# Patient Record
Sex: Female | Born: 1985 | Race: White | Hispanic: No | Smoking: Current every day smoker
Health system: Southern US, Community
[De-identification: ages and names within clinical notes are randomized; demographics above are authoritative.]

## PROBLEM LIST (undated history)

## (undated) DIAGNOSIS — F419 Anxiety disorder, unspecified: Secondary | ICD-10-CM

## (undated) DIAGNOSIS — R569 Unspecified convulsions: Secondary | ICD-10-CM

## (undated) DIAGNOSIS — J45909 Unspecified asthma, uncomplicated: Secondary | ICD-10-CM

## (undated) DIAGNOSIS — F319 Bipolar disorder, unspecified: Secondary | ICD-10-CM

---
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
## 2007-02-25 ENCOUNTER — Emergency Department: Payer: Self-pay | Admitting: Emergency Medicine

## 2007-04-06 ENCOUNTER — Emergency Department: Payer: Self-pay | Admitting: Emergency Medicine

## 2007-04-07 ENCOUNTER — Emergency Department (HOSPITAL_COMMUNITY): Admission: EM | Admit: 2007-04-07 | Discharge: 2007-04-08 | Payer: Self-pay | Admitting: Emergency Medicine

## 2007-08-26 ENCOUNTER — Observation Stay: Payer: Self-pay | Admitting: Obstetrics and Gynecology

## 2007-10-23 ENCOUNTER — Observation Stay: Payer: Self-pay

## 2007-10-24 ENCOUNTER — Ambulatory Visit: Payer: Self-pay

## 2007-12-18 ENCOUNTER — Inpatient Hospital Stay: Payer: Self-pay

## 2008-03-18 ENCOUNTER — Ambulatory Visit: Payer: Self-pay

## 2008-08-31 ENCOUNTER — Ambulatory Visit: Payer: Self-pay

## 2008-09-26 ENCOUNTER — Emergency Department: Payer: Self-pay | Admitting: Emergency Medicine

## 2009-04-09 ENCOUNTER — Observation Stay: Payer: Self-pay

## 2009-04-15 ENCOUNTER — Observation Stay: Payer: Self-pay | Admitting: Obstetrics & Gynecology

## 2009-05-06 ENCOUNTER — Inpatient Hospital Stay: Payer: Self-pay | Admitting: Unknown Physician Specialty

## 2009-08-19 ENCOUNTER — Emergency Department: Payer: Self-pay | Admitting: Emergency Medicine

## 2009-11-19 ENCOUNTER — Emergency Department: Payer: Self-pay | Admitting: Emergency Medicine

## 2010-01-25 IMAGING — US US OB < 14 WEEKS - US OB TV
1 series · 17 of 28 positions shown · non-contrast
Comparison: none

REASON FOR EXAM: abdominal pain
COMMENTS:   LMP: N/A

[Series 1: us ob < 14 weeks - us ob tv · 17 of 62 slices shown]
[im 1/62]
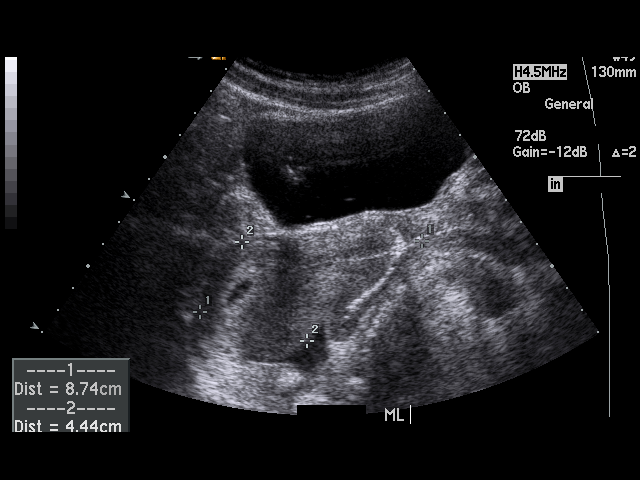
[im 5/62]
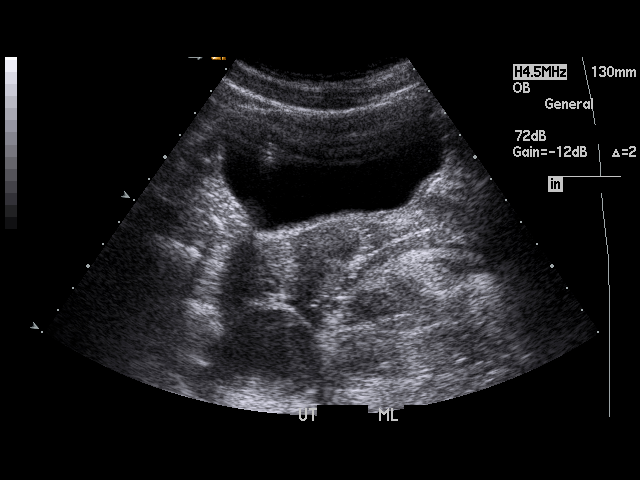
[im 10/62]
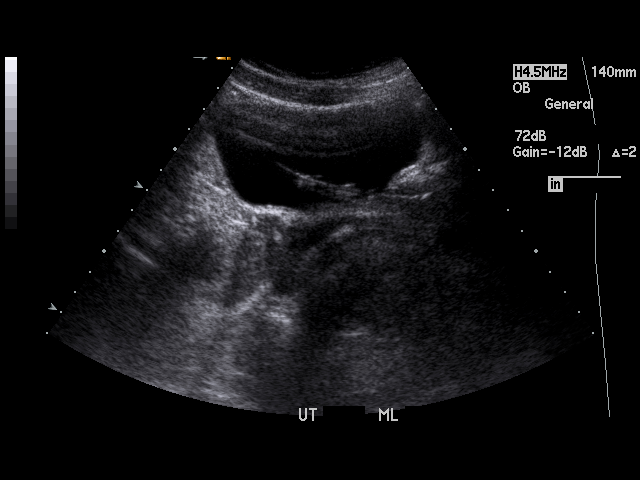
[im 12/62]
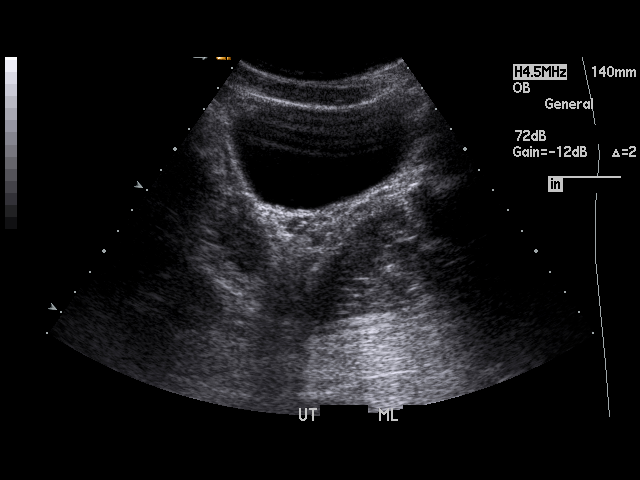
[im 16/62]
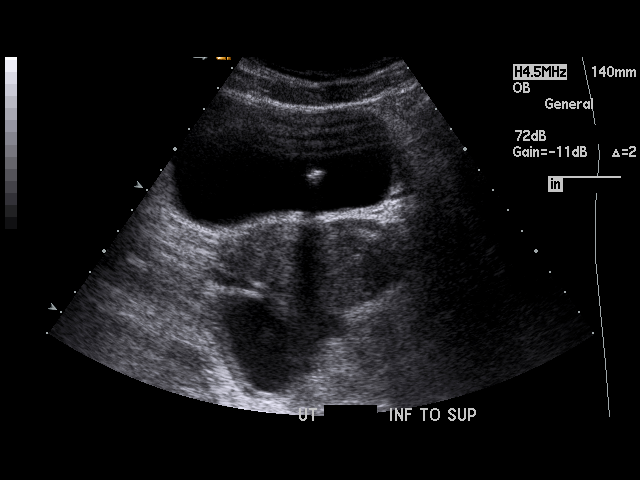
[im 21/62]
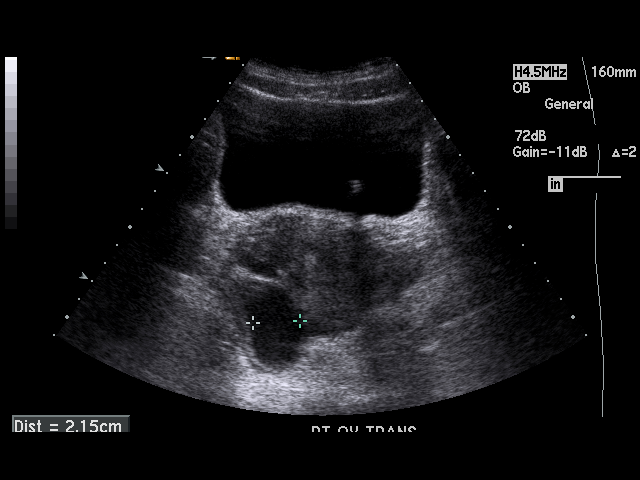
[im 23/62]
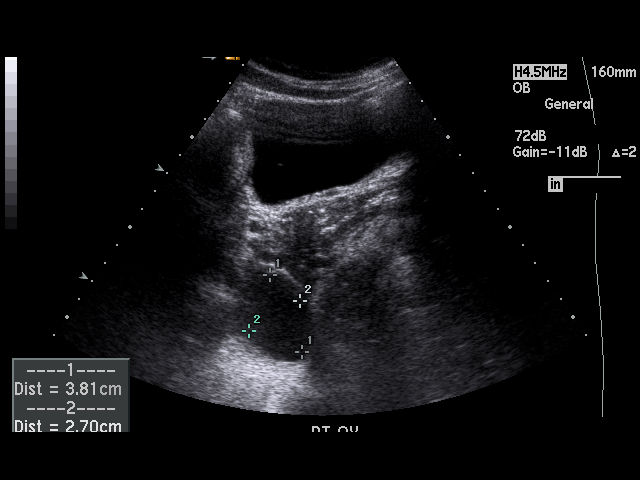
[im 28/62]
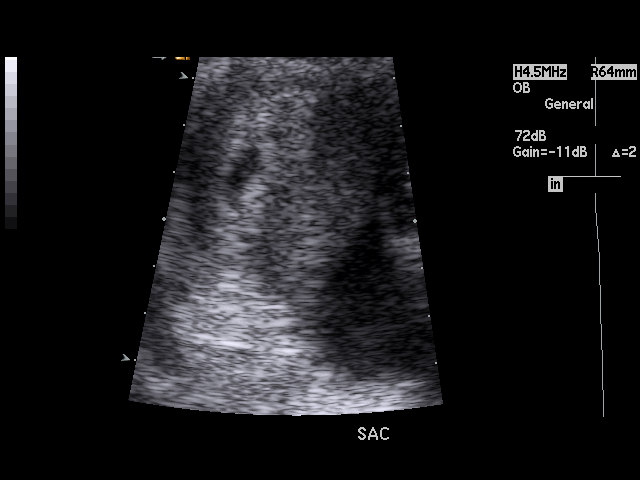
[im 32/62]
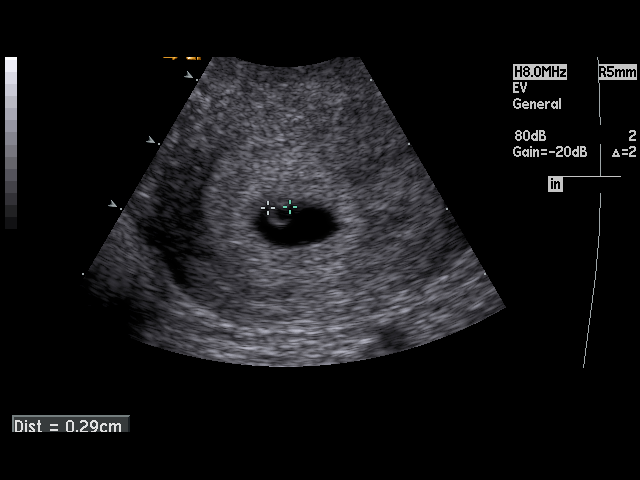
[im 34/62]
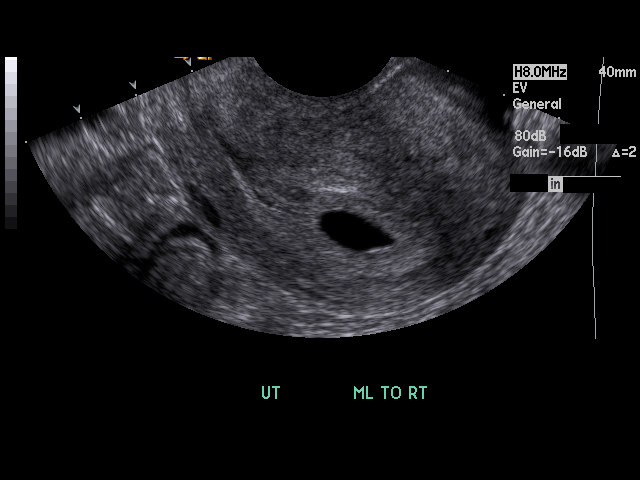
[im 39/62]
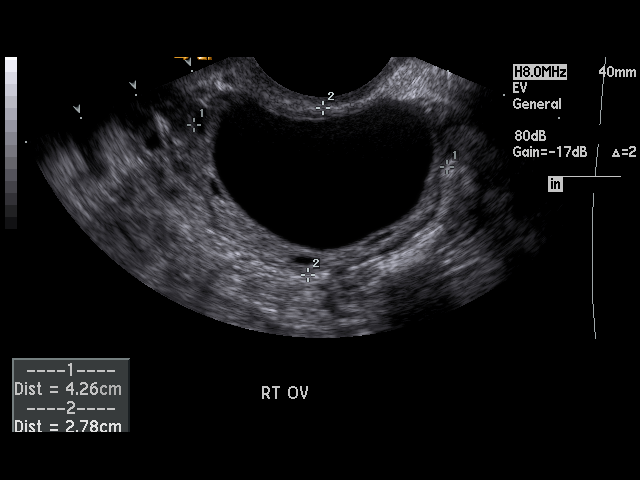
[im 41/62]
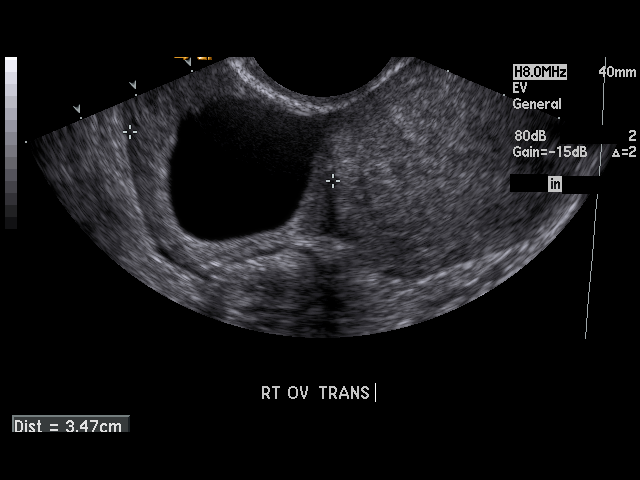
[im 46/62]
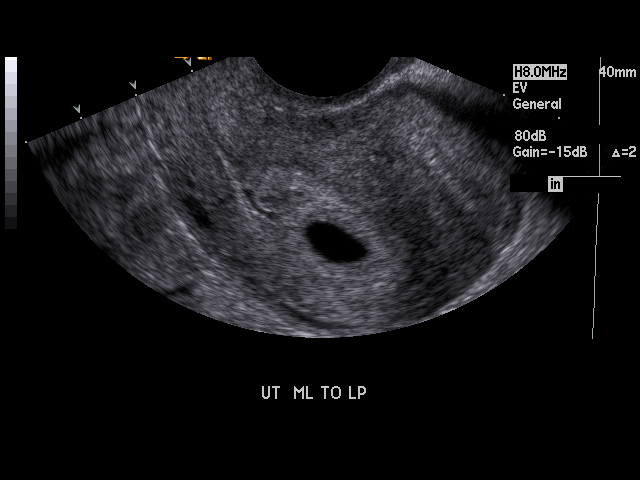
[im 50/62]
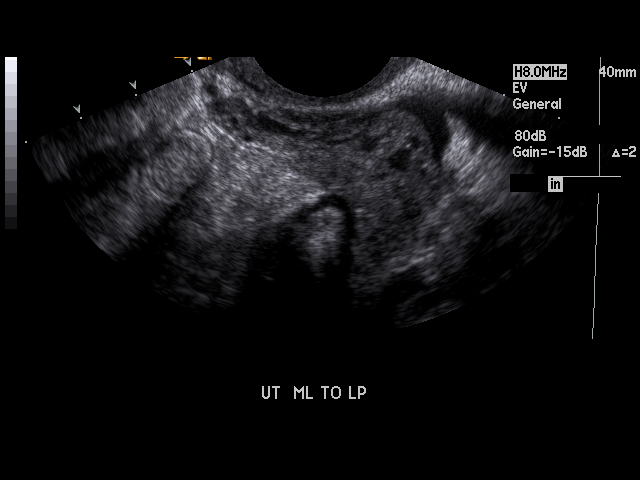
[im 52/62]
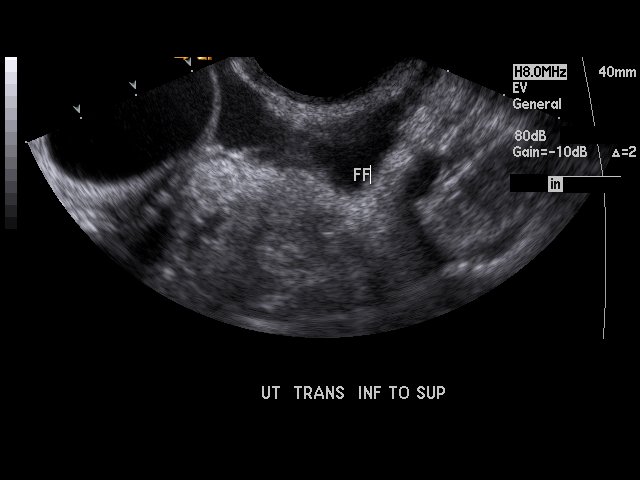
[im 57/62]
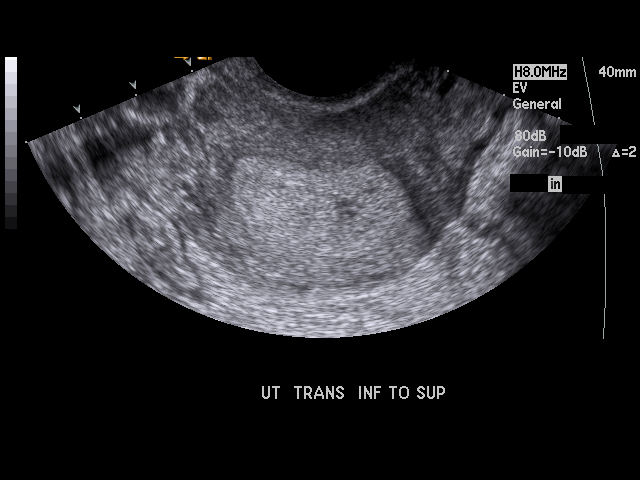
[im 62/62]
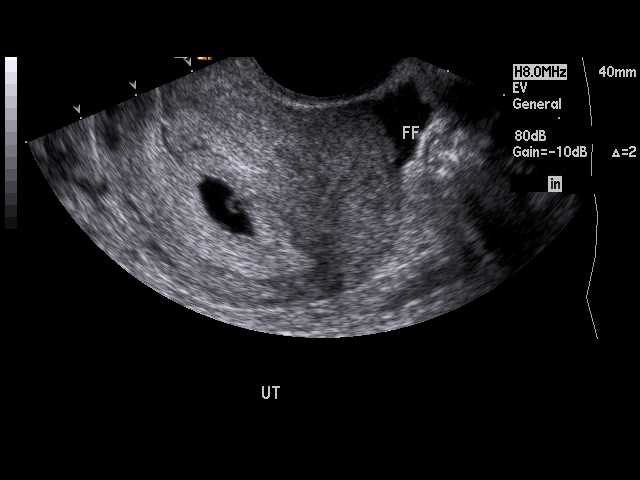

[17 of 28 positions shown; findings below may reference images not displayed]

PROCEDURE:     US  - US OB LESS THAN 14 WEEKS/W TRANS  - September 26, 2008  [DATE]

RESULT:     Transabdominal and endovaginal emergent pelvic sonogram is
performed utilizing an early OB protocol. The uterus measures 8.74 x 4.44 x
4.57 cm. There is a small amount of free fluid posterior to the uterus.
There appears to be an early intrauterine gestational sac with a yolk sac
present. No fetal pole is identified which can be within normal limits given
the early gestational age by sac size. There is a right ovarian cyst of
cm maximal diameter. Blood flow is present to both ovaries.
IMPRESSION: By the average gestational sac diameter of 1.14 cm the
estimated gestational age is 5 weeks-8 days with an estimated delivery of
05/27/2009. No fetal pole is evident which is not atypical given the early
gestational age. Routine follow-up is recommended with quantitative beta-hCG
and ultrasound. There is likely a corpus luteum cyst on the right ovary.
There is a small amount of free fluid present. The left ovary is normal.

## 2010-04-17 ENCOUNTER — Emergency Department (HOSPITAL_COMMUNITY)
Admission: EM | Admit: 2010-04-17 | Discharge: 2010-04-17 | Discharge status: Against Medical Advice | Payer: Medicaid Other | Attending: Emergency Medicine | Admitting: Emergency Medicine

## 2010-04-17 DIAGNOSIS — J45909 Unspecified asthma, uncomplicated: Secondary | ICD-10-CM | POA: Insufficient documentation

## 2010-04-17 DIAGNOSIS — R109 Unspecified abdominal pain: Secondary | ICD-10-CM | POA: Insufficient documentation

## 2010-04-17 DIAGNOSIS — Z79899 Other long term (current) drug therapy: Secondary | ICD-10-CM | POA: Insufficient documentation

## 2010-04-17 DIAGNOSIS — O9989 Other specified diseases and conditions complicating pregnancy, childbirth and the puerperium: Secondary | ICD-10-CM | POA: Insufficient documentation

## 2010-04-17 DIAGNOSIS — K219 Gastro-esophageal reflux disease without esophagitis: Secondary | ICD-10-CM | POA: Insufficient documentation

## 2010-04-17 DIAGNOSIS — N898 Other specified noninflammatory disorders of vagina: Secondary | ICD-10-CM | POA: Insufficient documentation

## 2010-04-17 LAB — WET PREP, GENITAL
Clue Cells Wet Prep HPF POC: NONE SEEN
Trich, Wet Prep: NONE SEEN
Yeast Wet Prep HPF POC: NONE SEEN

## 2010-04-17 LAB — POCT PREGNANCY, URINE: Preg Test, Ur: POSITIVE

## 2010-04-17 LAB — HCG, QUANTITATIVE, PREGNANCY: hCG, Beta Chain, Quant, S: 56299 m[IU]/mL — ABNORMAL HIGH (ref <5)

## 2010-04-18 LAB — GC/CHLAMYDIA PROBE AMP, GENITAL
Chlamydia, DNA Probe: NEGATIVE
GC Probe Amp, Genital: NEGATIVE

## 2010-05-15 ENCOUNTER — Encounter: Payer: Self-pay | Admitting: Obstetrics and Gynecology

## 2010-07-20 ENCOUNTER — Encounter: Payer: Self-pay | Admitting: Obstetrics & Gynecology

## 2010-08-31 ENCOUNTER — Encounter: Payer: Self-pay | Admitting: Obstetrics and Gynecology

## 2010-09-12 ENCOUNTER — Encounter: Payer: Self-pay | Admitting: Pediatrics

## 2010-09-18 ENCOUNTER — Encounter: Payer: Self-pay | Admitting: Maternal and Fetal Medicine

## 2010-09-22 ENCOUNTER — Observation Stay: Payer: Self-pay

## 2010-09-23 ENCOUNTER — Ambulatory Visit: Payer: Self-pay | Admitting: Obstetrics and Gynecology

## 2010-10-02 ENCOUNTER — Observation Stay: Payer: Self-pay

## 2010-10-20 ENCOUNTER — Observation Stay: Payer: Self-pay | Admitting: Obstetrics and Gynecology

## 2010-11-02 ENCOUNTER — Encounter: Payer: Self-pay | Admitting: Obstetrics and Gynecology

## 2010-11-04 ENCOUNTER — Observation Stay: Payer: Self-pay | Admitting: Obstetrics and Gynecology

## 2010-11-09 HISTORY — PX: TUBAL LIGATION: SHX77

## 2010-11-10 ENCOUNTER — Observation Stay: Payer: Self-pay | Admitting: Obstetrics & Gynecology

## 2010-11-13 ENCOUNTER — Observation Stay: Payer: Self-pay | Admitting: Obstetrics and Gynecology

## 2010-11-16 ENCOUNTER — Observation Stay: Payer: Self-pay | Admitting: Obstetrics & Gynecology

## 2010-11-17 ENCOUNTER — Inpatient Hospital Stay: Payer: Self-pay | Admitting: Obstetrics and Gynecology

## 2010-11-22 LAB — PATHOLOGY REPORT

## 2011-02-12 ENCOUNTER — Emergency Department: Payer: Self-pay | Admitting: Emergency Medicine

## 2011-02-12 LAB — URINALYSIS, COMPLETE
Blood: NEGATIVE
Glucose,UR: NEGATIVE mg/dL (ref 0–75)
Nitrite: NEGATIVE
Protein: NEGATIVE
RBC,UR: 2 /HPF (ref 0–5)
Squamous Epithelial: 4
WBC UR: 12 /HPF (ref 0–5)

## 2011-02-12 LAB — COMPREHENSIVE METABOLIC PANEL
Albumin: 3.7 g/dL (ref 3.4–5.0)
Alkaline Phosphatase: 47 U/L — ABNORMAL LOW (ref 50–136)
Anion Gap: 6 — ABNORMAL LOW (ref 7–16)
Bilirubin,Total: 0.2 mg/dL (ref 0.2–1.0)
Calcium, Total: 9.2 mg/dL (ref 8.5–10.1)
Creatinine: 0.97 mg/dL (ref 0.60–1.30)
Glucose: 91 mg/dL (ref 65–99)
Sodium: 141 mmol/L (ref 136–145)
Total Protein: 7.2 g/dL (ref 6.4–8.2)

## 2011-02-12 LAB — CBC
HCT: 39.7 % (ref 35.0–47.0)
HGB: 13.2 g/dL (ref 12.0–16.0)
MCHC: 33.2 g/dL (ref 32.0–36.0)
Platelet: 243 10*3/uL (ref 150–440)
RBC: 4.36 10*6/uL (ref 3.80–5.20)
WBC: 7.6 10*3/uL (ref 3.6–11.0)

## 2011-02-12 LAB — PREGNANCY, URINE: Pregnancy Test, Urine: NEGATIVE m[IU]/mL

## 2011-08-14 ENCOUNTER — Emergency Department: Payer: Self-pay | Admitting: Emergency Medicine

## 2011-08-14 LAB — COMPREHENSIVE METABOLIC PANEL
Alkaline Phosphatase: 59 U/L (ref 50–136)
Calcium, Total: 8.6 mg/dL (ref 8.5–10.1)
Co2: 27 mmol/L (ref 21–32)
EGFR (African American): >60
EGFR (Non-African Amer.): >60
SGOT(AST): 29 U/L (ref 15–37)
SGPT (ALT): 19 U/L (ref 12–78)

## 2011-08-14 LAB — URINALYSIS, COMPLETE
Bacteria: NONE SEEN
Leukocyte Esterase: NEGATIVE
Nitrite: NEGATIVE
Protein: NEGATIVE
RBC,UR: 1 /HPF (ref 0–5)
Squamous Epithelial: 1

## 2011-08-14 LAB — ETHANOL
Ethanol %: <0.003 % (ref 0.000–0.080)
Ethanol: <3 mg/dL

## 2011-08-14 LAB — CBC
HGB: 13.8 g/dL (ref 12.0–16.0)
MCH: 30.6 pg (ref 26.0–34.0)
Platelet: 268 10*3/uL (ref 150–440)
RBC: 4.51 10*6/uL (ref 3.80–5.20)
RDW: 12.8 % (ref 11.5–14.5)
WBC: 17.2 10*3/uL — ABNORMAL HIGH (ref 3.6–11.0)

## 2011-08-14 LAB — DRUG SCREEN, URINE
Amphetamines, Ur Screen: NEGATIVE (ref ?–1000)
Benzodiazepine, Ur Scrn: POSITIVE (ref ?–200)
Methadone, Ur Screen: NEGATIVE (ref ?–300)
Opiate, Ur Screen: POSITIVE (ref ?–300)
Tricyclic, Ur Screen: NEGATIVE (ref ?–1000)

## 2011-08-14 LAB — TSH: Thyroid Stimulating Horm: 1.48 u[IU]/mL

## 2011-08-14 LAB — SALICYLATE LEVEL: Salicylates, Serum: 2.3 mg/dL

## 2011-08-15 LAB — ACETAMINOPHEN LEVEL: Acetaminophen: <2 ug/mL

## 2011-09-13 IMAGING — US US OB NUCHAL TRANSLUCENCY 1ST GEST - [facility] NRPT
1 series · 14 of 28 positions shown · non-contrast
Comparison: none

[Series 1: us ob nuchal translucency 1st gest - [facility] nrpt · 14 of 58 slices shown]
[im 3/58]
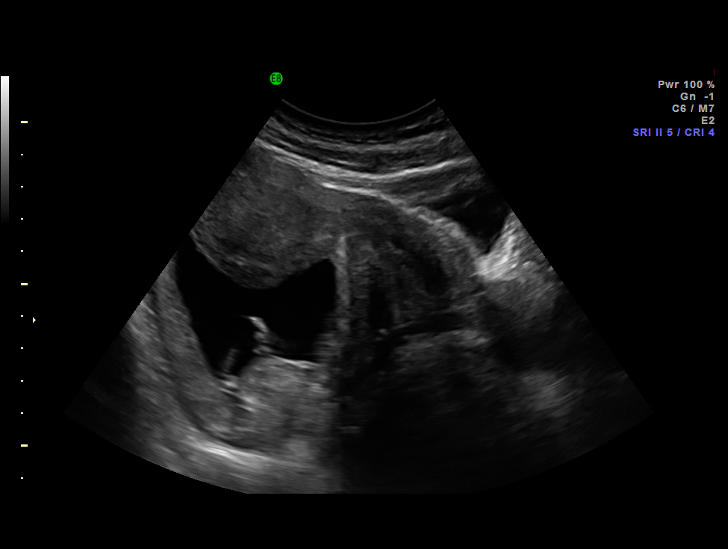
[im 7/58]
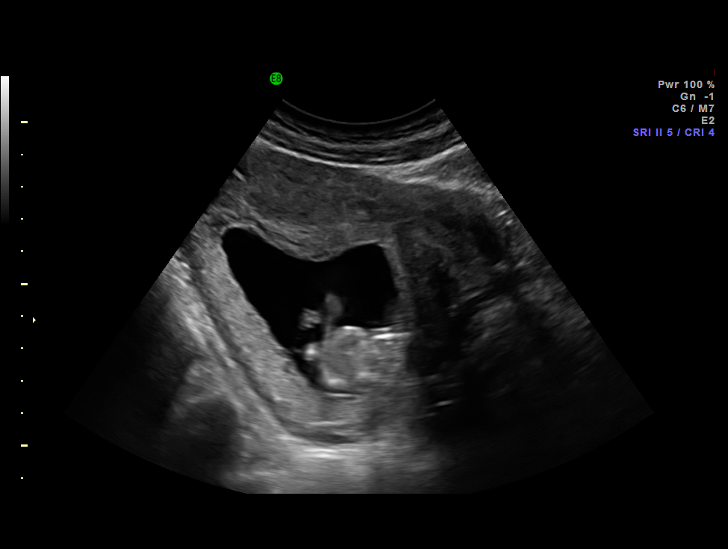
[im 11/58]
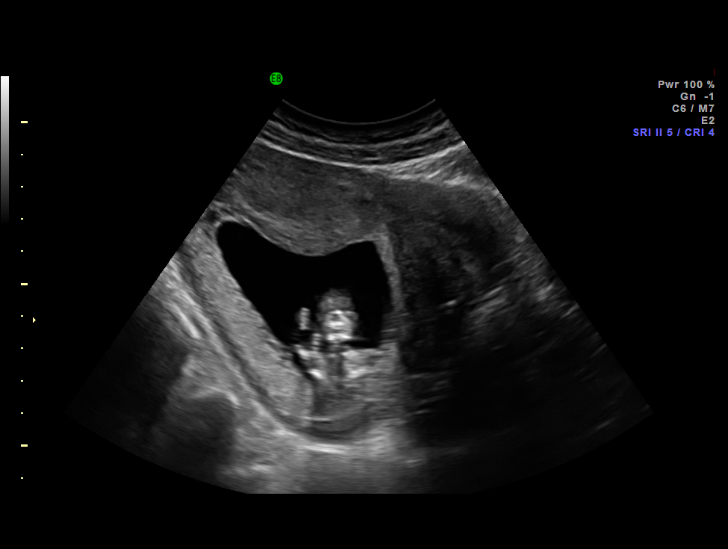
[im 15/58]
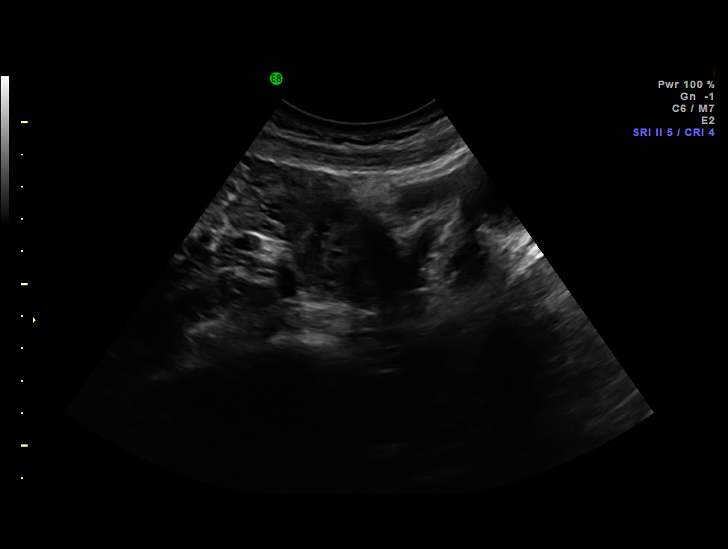
[im 20/58]
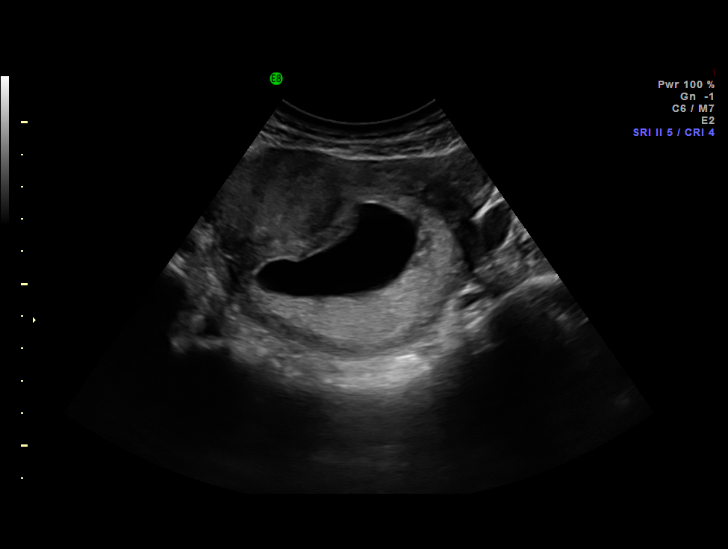
[im 24/58]
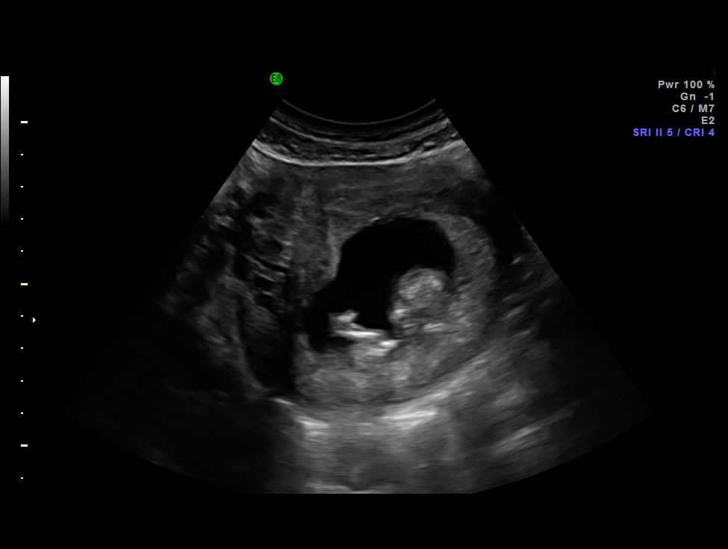
[im 28/58]
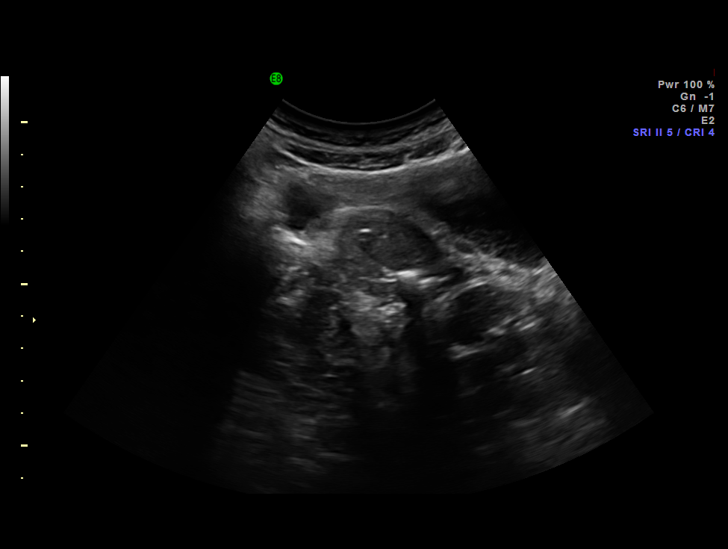
[im 32/58]
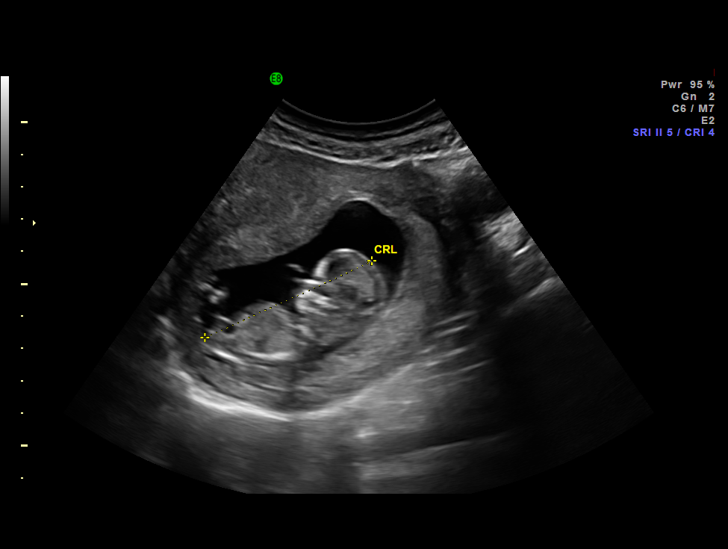
[im 36/58]
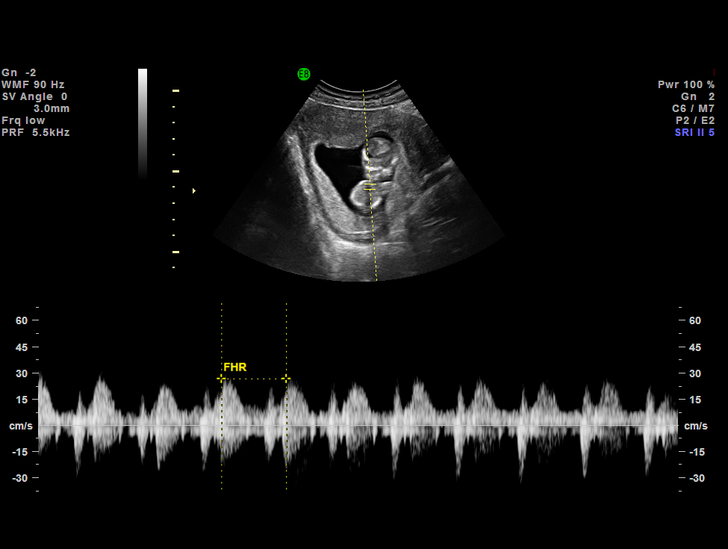
[im 41/58]
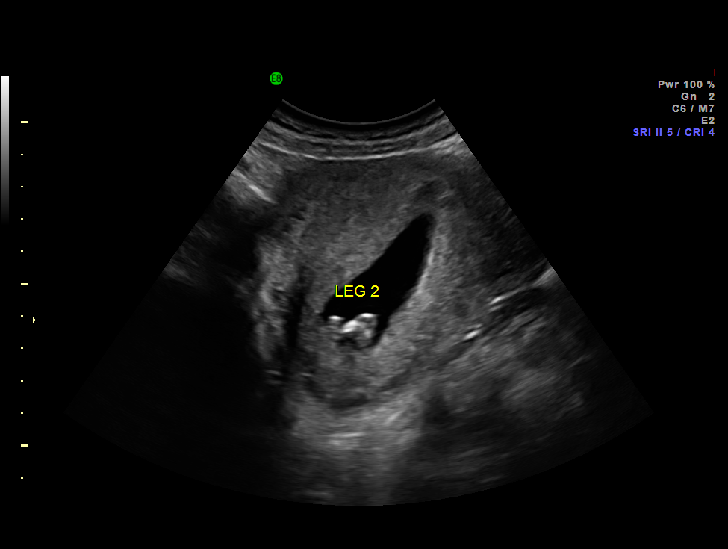
[im 45/58]
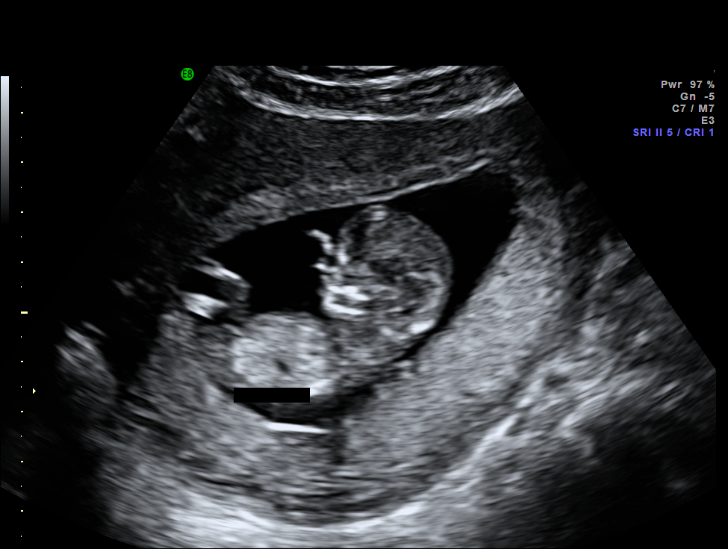
[im 49/58]
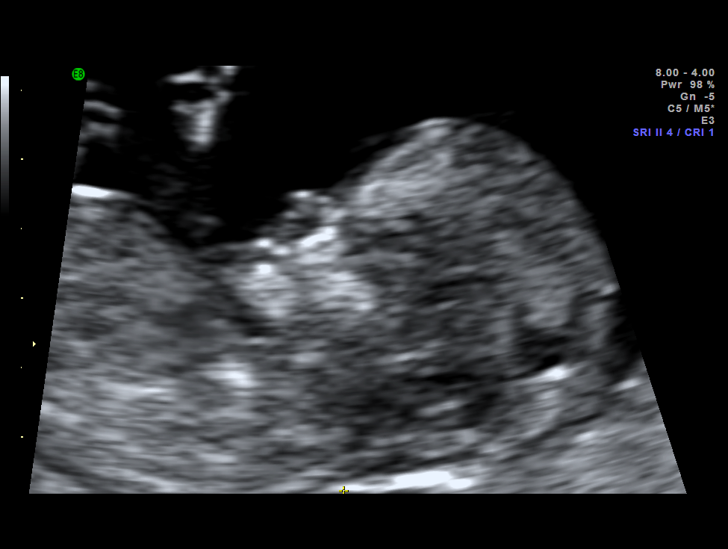
[im 53/58]
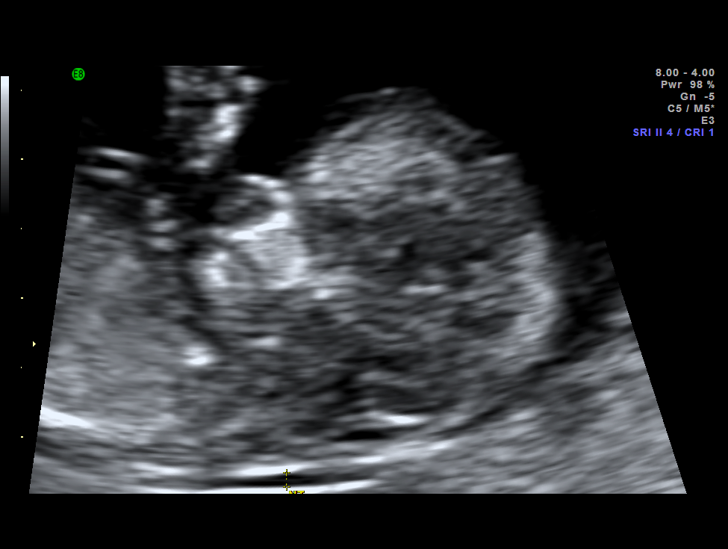
[im 58/58]
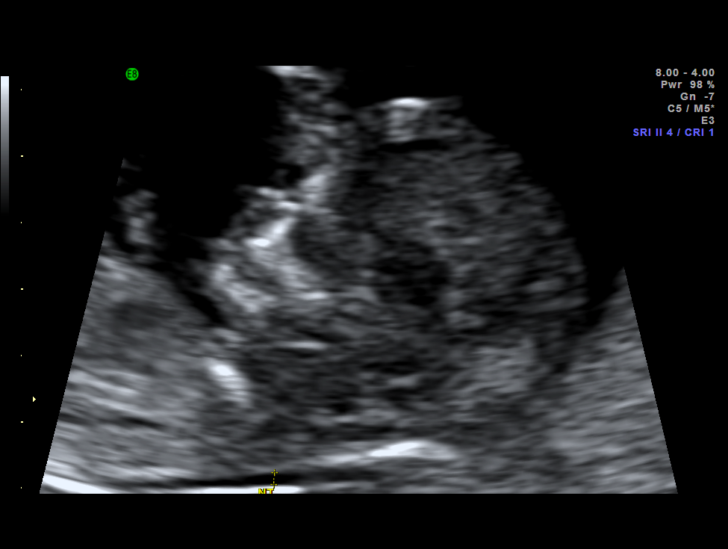

[14 of 28 positions shown; findings below may reference images not displayed]

IMAGES IMPORTED FROM THE [employer] WORKFLOW SYSTEM
NO DICTATION FOR STUDY

## 2012-07-03 ENCOUNTER — Encounter (HOSPITAL_COMMUNITY): Payer: Self-pay | Admitting: *Deleted

## 2012-07-03 ENCOUNTER — Emergency Department (HOSPITAL_COMMUNITY)
Admission: EM | Admit: 2012-07-03 | Discharge: 2012-07-03 | Disposition: A | Discharge status: Home / Self Care | Payer: Self-pay | Attending: Emergency Medicine | Admitting: Emergency Medicine

## 2012-07-03 DIAGNOSIS — X503XXA Overexertion from repetitive movements, initial encounter: Secondary | ICD-10-CM | POA: Insufficient documentation

## 2012-07-03 DIAGNOSIS — Y929 Unspecified place or not applicable: Secondary | ICD-10-CM | POA: Insufficient documentation

## 2012-07-03 DIAGNOSIS — R110 Nausea: Secondary | ICD-10-CM | POA: Insufficient documentation

## 2012-07-03 DIAGNOSIS — S39012A Strain of muscle, fascia and tendon of lower back, initial encounter: Secondary | ICD-10-CM

## 2012-07-03 DIAGNOSIS — Y9389 Activity, other specified: Secondary | ICD-10-CM | POA: Insufficient documentation

## 2012-07-03 DIAGNOSIS — F17200 Nicotine dependence, unspecified, uncomplicated: Secondary | ICD-10-CM | POA: Insufficient documentation

## 2012-07-03 DIAGNOSIS — S335XXA Sprain of ligaments of lumbar spine, initial encounter: Secondary | ICD-10-CM | POA: Insufficient documentation

## 2012-07-03 MED ORDER — IBUPROFEN 600 MG PO TABS: 600.0000 mg | ORAL_TABLET | Freq: Four times a day (QID) | ORAL | Status: DC | PRN

## 2012-07-03 MED ORDER — CYCLOBENZAPRINE HCL 5 MG PO TABS: 5.0000 mg | ORAL_TABLET | Freq: Three times a day (TID) | ORAL | Status: DC | PRN

## 2012-07-03 MED ORDER — HYDROCODONE-ACETAMINOPHEN 5-325 MG PO TABS: 1.0000 | ORAL_TABLET | ORAL | Status: DC | PRN

## 2012-07-03 MED ORDER — HYDROCODONE-ACETAMINOPHEN 5-325 MG PO TABS
1.0000 | ORAL_TABLET | Freq: Once | ORAL | Status: AC
  Administered 2012-07-03: 1 via ORAL
  Filled 2012-07-03: qty 1

## 2012-07-03 MED ORDER — IBUPROFEN 400 MG PO TABS
400.0000 mg | ORAL_TABLET | Freq: Once | ORAL | Status: AC
  Administered 2012-07-03: 400 mg via ORAL
  Filled 2012-07-03: qty 1

## 2012-07-03 NOTE — ED Provider Notes (Signed)
Medical screening examination/treatment/procedure(s) were performed by non-physician practitioner and as supervising physician I was immediately available for consultation/collaboration. Mardene Lessig, MD, FACEP   Kuzey Ogata L Chantry Headen, MD 07/03/12 2329 

## 2012-07-03 NOTE — ED Provider Notes (Signed)
History    CSN: [id number] Arrival date & time 07/03/12  2056  First MD Initiated Contact with Patient 07/03/12 2126     Chief Complaint  Patient presents with  . Back Pain   (Consider location/radiation/quality/duration/timing/severity/associated sxs/prior Treatment) Patient is a 27 y.o. female presenting with back pain. The history is provided by the patient.  Back Pain Location:  Lumbar spine Radiates to:  R thigh Pain severity:  Moderate Pain is:  Same all the time Onset quality:  Gradual Duration:  3 days Timing:  Constant Progression:  Worsening Chronicity:  New Associated symptoms: no chest pain, no dysuria and no headaches   Isabel Robertson is a 27 y.o. female who presents to the ED with low back pain. The pain started 3 days ago she took tylenol and it got better for a while and then came back, took aspirin and helped some but today the pain got worse and goes down right leg. No known injury. She does pick up her children a lot and could have strained without knowing it at the time. The pain today was so bad it caused her to be nauseated. She denies loss of control of bladder or bowels. Never had back problems before now. History reviewed. No pertinent past medical history. Past Surgical History  Procedure Laterality Date  . Tubal ligation  11/2010   History reviewed. No pertinent family history. History  Substance Use Topics  . Smoking status: Current Every Day Smoker -- 0.50 packs/day    Types: Cigarettes  . Smokeless tobacco: Not on file  . Alcohol Use: Yes   OB History   Grav Para Term Preterm Abortions TAB SAB Ect Mult Living                 Review of Systems  HENT: Negative for congestion and sore throat.   Eyes: Negative for visual disturbance.  Respiratory: Negative for shortness of breath.   Cardiovascular: Negative for chest pain.  Gastrointestinal: Positive for nausea.  Genitourinary: Negative for dysuria and frequency.  Musculoskeletal: Positive  for back pain.  Skin: Negative for rash.  Allergic/Immunologic: Negative for immunocompromised state.  Neurological: Negative for syncope and headaches.  Psychiatric/Behavioral: The patient is not nervous/anxious.     Allergies  Eggs or egg-derived products and Penicillins  Home Medications  No current outpatient prescriptions on file. BP 109/76  Pulse 87  Temp(Src) 97.8 F (36.6 C) (Oral)  Resp 16  SpO2 100%  LMP 07/03/2012 Physical Exam  Nursing note and vitals reviewed. Constitutional: She is oriented to person, place, and time. She appears well-developed and well-nourished. No distress.  HENT:  Head: Normocephalic.  Eyes: Conjunctivae and EOM are normal.  Neck: Neck supple.  Cardiovascular: Normal rate, regular rhythm and normal heart sounds.   Pulmonary/Chest: Effort normal and breath sounds normal.  Abdominal: Soft. There is no tenderness.  Musculoskeletal:       Lumbar back: She exhibits decreased range of motion, tenderness and spasm. She exhibits no deformity and normal pulse.       Back:  Pedal pulses strong and equal bilateral. Good touch sensation, adequate circulation.   Neurological: She is alert and oriented to person, place, and time. She has normal strength and normal reflexes. No cranial nerve deficit or sensory deficit. Gait normal.  Patient ambulatory without difficulty. Pain with bending forward and side to side.   Skin: Skin is warm and dry.  Psychiatric: She has a normal mood and affect. Her behavior is normal.  ED Course  Procedures  MDM  27 y.o. female with lumbar strain. Will treat for pain and inflammation. She will follow up with her PCP or return here as needed.  Discussed with the patient clinical findings and plan of care and all questioned fully answered.   Medication List    TAKE these medications       cyclobenzaprine 5 MG tablet  Commonly known as:  FLEXERIL  Take 1 tablet (5 mg total) by mouth 3 (three) times daily as needed  for muscle spasms.     HYDROcodone-acetaminophen 5-325 MG per tablet  Commonly known as:  NORCO/VICODIN  Take 1 tablet by mouth every 4 (four) hours as needed.     ibuprofen 600 MG tablet  Commonly known as:  ADVIL,MOTRIN  Take 1 tablet (600 mg total) by mouth every 6 (six) hours as needed for pain.         [place], [place] 07/03/12 2147

## 2012-07-03 NOTE — ED Notes (Signed)
Pt co lower back pain with radiation down rt leg x3 days. Pt has tried tylenol and BC with no relief. Pain constant now.

## 2012-07-03 NOTE — ED Notes (Signed)
Mayer Camel, NP examining pt

## 2012-09-15 ENCOUNTER — Encounter (HOSPITAL_COMMUNITY): Payer: Self-pay | Admitting: *Deleted

## 2012-09-15 ENCOUNTER — Emergency Department (HOSPITAL_COMMUNITY)
Admission: EM | Admit: 2012-09-15 | Discharge: 2012-09-15 | Disposition: A | Discharge status: Home / Self Care | Payer: Medicaid Other | Attending: Emergency Medicine | Admitting: Emergency Medicine

## 2012-09-15 DIAGNOSIS — IMO0002 Reserved for concepts with insufficient information to code with codable children: Secondary | ICD-10-CM | POA: Insufficient documentation

## 2012-09-15 DIAGNOSIS — Z8619 Personal history of other infectious and parasitic diseases: Secondary | ICD-10-CM | POA: Insufficient documentation

## 2012-09-15 DIAGNOSIS — F17200 Nicotine dependence, unspecified, uncomplicated: Secondary | ICD-10-CM | POA: Insufficient documentation

## 2012-09-15 DIAGNOSIS — L02412 Cutaneous abscess of left axilla: Secondary | ICD-10-CM

## 2012-09-15 DIAGNOSIS — Z91040 Latex allergy status: Secondary | ICD-10-CM | POA: Insufficient documentation

## 2012-09-15 DIAGNOSIS — Z880 Allergy status to penicillin: Secondary | ICD-10-CM | POA: Insufficient documentation

## 2012-09-15 MED ORDER — LIDOCAINE HCL (PF) 2 % IJ SOLN
INTRAMUSCULAR | Status: AC
  Administered 2012-09-15: 11:00:00
  Filled 2012-09-15: qty 10

## 2012-09-15 MED ORDER — HYDROCODONE-ACETAMINOPHEN 5-325 MG PO TABS: 1.0000 | ORAL_TABLET | ORAL | Status: DC | PRN

## 2012-09-15 MED ORDER — SULFAMETHOXAZOLE-TRIMETHOPRIM 800-160 MG PO TABS: 1.0000 | ORAL_TABLET | Freq: Two times a day (BID) | ORAL | Status: AC

## 2012-09-15 NOTE — ED Notes (Signed)
Abscess under left arm x 2 days.

## 2012-09-15 NOTE — ED Provider Notes (Signed)
CSN: [id number]     Arrival date & time 09/15/12  0909 History   First MD Initiated Contact with Patient 09/15/12 [phone]     Chief Complaint  Patient presents with  . Abscess   (Consider location/radiation/quality/duration/timing/severity/associated sxs/prior Treatment) Patient is a 27 y.o. female presenting with abscess. The history is provided by the patient and the spouse.  Abscess Location:  Shoulder/arm Shoulder/arm abscess location:  L axilla Abscess quality: induration, painful and redness   Abscess quality: not draining   Red streaking: no   Duration:  2 days Progression:  Worsening Pain details:    Quality:  Throbbing, pressure and sharp   Severity:  Severe   Duration:  2 days   Timing:  Constant   Progression:  Worsening Context: not diabetes   Relieved by:  Nothing Worsened by:  Draining/squeezing Ineffective treatments:  Warm compresses Associated symptoms: no fever and no nausea   Risk factors: prior abscess   Risk factors: no hx of MRSA   Risk factors comment:  History of non mrsa staph infection   History reviewed. No pertinent past medical history. Past Surgical History  Procedure Laterality Date  . Tubal ligation  11/2010   No family history on file. History  Substance Use Topics  . Smoking status: Current Every Day Smoker -- 0.50 packs/day    Types: Cigarettes  . Smokeless tobacco: Not on file  . Alcohol Use: Yes   OB History   Grav Para Term Preterm Abortions TAB SAB Ect Mult Living                 Review of Systems  Constitutional: Negative for fever and chills.  HENT: Negative for facial swelling.   Respiratory: Negative for shortness of breath and wheezing.   Gastrointestinal: Negative for nausea.  Skin: Positive for color change and wound.  Neurological: Negative for numbness.    Allergies  Eggs or egg-derived products; Penicillins; and Latex  Home Medications   Current Outpatient Rx  Name  Route  Sig  Dispense  Refill  .  acetaminophen (TYLENOL) 500 MG tablet   Oral   Take 1,000 mg by mouth every 6 (six) hours as needed for pain.         Marland Kitchen ibuprofen (ADVIL,MOTRIN) 200 MG tablet   Oral   Take 400 mg by mouth every 6 (six) hours as needed for pain.         Marland Kitchen HYDROcodone-acetaminophen (NORCO/VICODIN) 5-325 MG per tablet   Oral   Take 1 tablet by mouth every 4 (four) hours as needed for pain.   15 tablet   0   . sulfamethoxazole-trimethoprim (BACTRIM DS,SEPTRA DS) 800-160 MG per tablet   Oral   Take 1 tablet by mouth 2 (two) times daily.   20 tablet   0    BP 117/75  Pulse 105  Temp(Src) 98.4 F (36.9 C) (Oral)  Resp 16  Ht 5\' 4"  (1.626 m)  Wt 130 lb (58.968 kg)  BMI 22.3 kg/m2  SpO2 97%  LMP 08/22/2012 Physical Exam  Constitutional: She is oriented to person, place, and time. She appears well-developed and well-nourished.  HENT:  Head: Normocephalic.  Cardiovascular: Normal rate.   Pulmonary/Chest: Effort normal.  Musculoskeletal: She exhibits tenderness.  Neurological: She is alert and oriented to person, place, and time. No sensory deficit.  Skin:  2 cm raised abscess left axilla.  Pointing but not draining with small central pustule.  No fluctuance.  Indurated.  No red streaking.  ED Course  Procedures (including critical care time)  INCISION AND DRAINAGE Performed by: Burgess Amor Consent: Verbal consent obtained. Risks and benefits: risks, benefits and alternatives were discussed Type: abscess  Body area: left axilla  Anesthesia: local infiltration  Incision was made with a scalpel.  Local anesthetic: lidocaine 2% without epinephrine  Anesthetic total: 3 ml  Complexity: complex Blunt dissection to break up loculations  Drainage: purulent  Drainage amount: small  Packing material: none  Patient tolerance: Patient tolerated the procedure well with no immediate complications.    Labs Review Labs Reviewed - No data to display Imaging Review No results  found.  MDM   1. Abscess of left axilla    Recommended patient start warm compresses today.  She was prescribed hydrocodone and Bactrim.  When necessary followup here for any worsened symptoms.    Burgess Amor, PA-C 09/15/12 1049

## 2012-09-15 NOTE — ED Provider Notes (Signed)
Medical screening examination/treatment/procedure(s) were performed by non-physician practitioner and as supervising physician I was immediately available for consultation/collaboration.   Kha Hari L Manami Tutor, MD 09/15/12 1516 

## 2012-09-15 NOTE — ED Notes (Signed)
Telfa applied to left axilla and taped down with medical tape by EMS students. Pt tolerated well. nad noted.

## 2012-12-25 ENCOUNTER — Emergency Department (HOSPITAL_COMMUNITY)
Admission: EM | Admit: 2012-12-25 | Discharge: 2012-12-25 | Discharge status: Against Medical Advice | Payer: Medicaid Other | Attending: Emergency Medicine | Admitting: Emergency Medicine

## 2012-12-25 ENCOUNTER — Encounter (HOSPITAL_COMMUNITY): Payer: Self-pay | Admitting: Emergency Medicine

## 2012-12-25 DIAGNOSIS — F17200 Nicotine dependence, unspecified, uncomplicated: Secondary | ICD-10-CM | POA: Insufficient documentation

## 2012-12-25 DIAGNOSIS — H5710 Ocular pain, unspecified eye: Secondary | ICD-10-CM | POA: Insufficient documentation

## 2012-12-25 NOTE — ED Notes (Signed)
Eye red and painful, onset this am. Had slept with contacts in.

## 2012-12-25 NOTE — ED Notes (Signed)
No answer

## 2012-12-26 ENCOUNTER — Ambulatory Visit (HOSPITAL_COMMUNITY): Admission: RE | Admit: 2012-12-26 | Payer: Self-pay | Source: Ambulatory Visit

## 2012-12-26 ENCOUNTER — Encounter (HOSPITAL_COMMUNITY): Payer: Self-pay | Admitting: Emergency Medicine

## 2012-12-26 ENCOUNTER — Emergency Department (HOSPITAL_COMMUNITY)
Admission: EM | Admit: 2012-12-26 | Discharge: 2012-12-26 | Disposition: A | Discharge status: Home / Self Care | Payer: Medicaid Other | Attending: Emergency Medicine | Admitting: Emergency Medicine

## 2012-12-26 DIAGNOSIS — Z91040 Latex allergy status: Secondary | ICD-10-CM | POA: Insufficient documentation

## 2012-12-26 DIAGNOSIS — Z880 Allergy status to penicillin: Secondary | ICD-10-CM | POA: Insufficient documentation

## 2012-12-26 DIAGNOSIS — F17200 Nicotine dependence, unspecified, uncomplicated: Secondary | ICD-10-CM | POA: Insufficient documentation

## 2012-12-26 DIAGNOSIS — H109 Unspecified conjunctivitis: Secondary | ICD-10-CM | POA: Insufficient documentation

## 2012-12-26 MED ORDER — TOBRAMYCIN 0.3 % OP SOLN
2.0000 [drp] | Freq: Once | OPHTHALMIC | Status: AC
  Administered 2012-12-26: 2 [drp] via OPHTHALMIC
  Filled 2012-12-26: qty 5

## 2012-12-26 MED ORDER — TETRACAINE HCL 0.5 % OP SOLN
2.0000 [drp] | Freq: Once | OPHTHALMIC | Status: AC
  Administered 2012-12-26: 2 [drp] via OPHTHALMIC
  Filled 2012-12-26: qty 2

## 2012-12-26 MED ORDER — FLUORESCEIN SODIUM 1 MG OP STRP
ORAL_STRIP | OPHTHALMIC | Status: AC
  Administered 2012-12-26: 17:00:00
  Filled 2012-12-26: qty 1

## 2012-12-26 MED ORDER — DICLOFENAC SODIUM 75 MG PO TBEC: 75.0000 mg | DELAYED_RELEASE_TABLET | Freq: Two times a day (BID) | ORAL | Status: DC

## 2012-12-26 MED ORDER — HYDROCODONE-ACETAMINOPHEN 5-325 MG PO TABS: ORAL_TABLET | ORAL | Status: DC

## 2012-12-26 NOTE — ED Provider Notes (Signed)
CSN: [id number]     Arrival date & time 12/26/12  1611 History   First MD Initiated Contact with Patient 12/26/12 1625     Chief Complaint  Patient presents with  . Eye Injury   (Consider location/radiation/quality/duration/timing/severity/associated sxs/prior Treatment) HPI Comments: 27y/o female presents to the ED with c/o right eye pain. Pt wears contact lens. She sleep in the contacts earlier and now has redness and pain. She feels she may have a scratch. No double vision. Mild blurred vision. No hx of operation or procedure. No previous injury reported. She has been using tylenol for pain, but this has been unsuccessful.  Patient is a 26 y.o. female presenting with eye injury. The history is provided by the patient.  Eye Injury Pertinent negatives include no abdominal pain, arthralgias, chest pain, coughing or neck pain.    History reviewed. No pertinent past medical history. Past Surgical History  Procedure Laterality Date  . Tubal ligation  11/2010   No family history on file. History  Substance Use Topics  . Smoking status: Current Every Day Smoker -- 0.50 packs/day    Types: Cigarettes  . Smokeless tobacco: Not on file  . Alcohol Use: Yes   OB History   Grav Para Term Preterm Abortions TAB SAB Ect Mult Living                 Review of Systems  Constitutional: Negative for activity change.       All ROS Neg except as noted in HPI  HENT: Negative for nosebleeds.   Eyes: Positive for pain and redness. Negative for photophobia and discharge.  Respiratory: Negative for cough, shortness of breath and wheezing.   Cardiovascular: Negative for chest pain and palpitations.  Gastrointestinal: Negative for abdominal pain and blood in stool.  Genitourinary: Negative for dysuria, frequency and hematuria.  Musculoskeletal: Negative for arthralgias, back pain and neck pain.  Skin: Negative.   Neurological: Negative for dizziness, seizures and speech difficulty.   Psychiatric/Behavioral: Negative for hallucinations and confusion.    Allergies  Eggs or egg-derived products; Penicillins; and Latex  Home Medications   Current Outpatient Rx  Name  Route  Sig  Dispense  Refill  . acetaminophen (TYLENOL) 500 MG tablet   Oral   Take 1,000 mg by mouth every 6 (six) hours as needed for pain.         Marland Kitchen HYDROcodone-acetaminophen (NORCO/VICODIN) 5-325 MG per tablet   Oral   Take 1 tablet by mouth every 4 (four) hours as needed for pain.   15 tablet   0   . ibuprofen (ADVIL,MOTRIN) 200 MG tablet   Oral   Take 400 mg by mouth every 6 (six) hours as needed for pain.          BP 115/88  Pulse 80  Temp(Src) 98.2 F (36.8 C) (Oral)  Resp 18  Ht 5\' 4"  (1.626 m)  Wt 125 lb (56.7 kg)  BMI 21.45 kg/m2  SpO2 100%  LMP 12/11/2012 Physical Exam  Nursing note and vitals reviewed. Constitutional: She is oriented to person, place, and time. She appears well-developed and well-nourished.  Non-toxic appearance.  HENT:  Head: Normocephalic.  Right Ear: Tympanic membrane and external ear normal.  Left Ear: Tympanic membrane and external ear normal.  Eyes: EOM and lids are normal. Pupils are equal, round, and reactive to light. Lids are everted and swept, no foreign bodies found. Right eye exhibits no chemosis and no hordeolum. No foreign body present in the right  eye. Right conjunctiva is injected. No scleral icterus.  Fundoscopic exam:      The right eye shows no AV nicking, no exudate, no hemorrhage and no papilledema.  Slit lamp exam:      The right eye shows no corneal abrasion, no corneal ulcer, no foreign body, no hyphema and no fluorescein uptake.  Neck: Normal range of motion. Neck supple. Carotid bruit is not present.  Cardiovascular: Normal rate, regular rhythm, normal heart sounds, intact distal pulses and normal pulses.   Pulmonary/Chest: Breath sounds normal. No respiratory distress.  Abdominal: Soft. Bowel sounds are normal. There is no  tenderness. There is no guarding.  Musculoskeletal: Normal range of motion.  Lymphadenopathy:       Head (right side): No submandibular adenopathy present.       Head (left side): No submandibular adenopathy present.    She has no cervical adenopathy.  Neurological: She is alert and oriented to person, place, and time. She has normal strength. No cranial nerve deficit or sensory deficit.  Skin: Skin is warm and dry.  Psychiatric: She has a normal mood and affect. Her speech is normal.    ED Course  Procedures (including critical care time) Labs Review Labs Reviewed - No data to display Imaging Review No results found.  EKG Interpretation   None       MDM  No diagnosis found. *I have reviewed nursing notes, vital signs, and all appropriate lab and imaging results for this patient.**  Vital signs are well within normal limits. Also oximetry is 100% on room air. Within normal limits by my interpretation. Patient would not cooperate for visual acuity.  Examination is negative for any corneal abrasion or corneal ulcer at this time. There is a significant amount of conjunctival swelling and injection. The plan at this time is for the patient to use cool compresses, the patient is given tobramycin eyedrops to use every 4 hours for the next 5 days. Patient is advised to use diclofenac 2 times daily for swelling and inflammation, and given a prescription for Norco for pain. Patient is advised to see the eye specialist or return to the emergency department if not improving. Patient further advised not to use her contacts until this has completely healed.  Kathie Dike, PA-C 12/26/12 [phone]

## 2012-12-26 NOTE — ED Provider Notes (Signed)
Medical screening examination/treatment/procedure(s) were performed by non-physician practitioner and as supervising physician I was immediately available for consultation/collaboration.  EKG Interpretation   None       Devoria Albe, MD, [facility] Gang   Ward Givens, MD 12/26/12 [phone]

## 2012-12-26 NOTE — ED Notes (Signed)
Pain, redness rt eye, Came to ER last night, but left before seen by MD

## 2012-12-26 NOTE — ED Notes (Signed)
Pt unable to complete eye exam

## 2012-12-26 NOTE — ED Notes (Signed)
Pt reports sleeping in her contacts and now thinks she has scratched her right eye, was in the ed last night, but had to leave.

## 2013-01-05 ENCOUNTER — Emergency Department (HOSPITAL_COMMUNITY)
Admission: EM | Admit: 2013-01-05 | Discharge: 2013-01-05 | Discharge status: Against Medical Advice | Payer: Medicaid Other | Attending: Emergency Medicine | Admitting: Emergency Medicine

## 2013-01-05 ENCOUNTER — Encounter (HOSPITAL_COMMUNITY): Payer: Self-pay | Admitting: Emergency Medicine

## 2013-01-05 DIAGNOSIS — R509 Fever, unspecified: Secondary | ICD-10-CM | POA: Insufficient documentation

## 2013-01-05 DIAGNOSIS — R52 Pain, unspecified: Secondary | ICD-10-CM | POA: Insufficient documentation

## 2013-01-05 DIAGNOSIS — R197 Diarrhea, unspecified: Secondary | ICD-10-CM | POA: Insufficient documentation

## 2013-01-05 DIAGNOSIS — R1110 Vomiting, unspecified: Secondary | ICD-10-CM | POA: Insufficient documentation

## 2013-01-05 HISTORY — DX: Unspecified asthma, uncomplicated: J45.909

## 2013-01-05 NOTE — ED Notes (Signed)
Left without notifying staff.  Called to room from waiting room with no answer.

## 2013-01-05 NOTE — ED Notes (Signed)
Vomiting , diarrhea,fever, chills, body aches.

## 2013-03-05 ENCOUNTER — Encounter (HOSPITAL_COMMUNITY): Payer: Self-pay | Admitting: Emergency Medicine

## 2013-03-05 ENCOUNTER — Emergency Department (HOSPITAL_COMMUNITY)
Admission: EM | Admit: 2013-03-05 | Discharge: 2013-03-05 | Disposition: A | Discharge status: Home / Self Care | Payer: Medicaid Other | Attending: Emergency Medicine | Admitting: Emergency Medicine

## 2013-03-05 DIAGNOSIS — M25519 Pain in unspecified shoulder: Secondary | ICD-10-CM | POA: Insufficient documentation

## 2013-03-05 DIAGNOSIS — Z91040 Latex allergy status: Secondary | ICD-10-CM | POA: Insufficient documentation

## 2013-03-05 DIAGNOSIS — G5602 Carpal tunnel syndrome, left upper limb: Secondary | ICD-10-CM

## 2013-03-05 DIAGNOSIS — G5600 Carpal tunnel syndrome, unspecified upper limb: Secondary | ICD-10-CM | POA: Insufficient documentation

## 2013-03-05 DIAGNOSIS — J45909 Unspecified asthma, uncomplicated: Secondary | ICD-10-CM | POA: Insufficient documentation

## 2013-03-05 DIAGNOSIS — Z880 Allergy status to penicillin: Secondary | ICD-10-CM | POA: Insufficient documentation

## 2013-03-05 DIAGNOSIS — M25512 Pain in left shoulder: Secondary | ICD-10-CM

## 2013-03-05 DIAGNOSIS — F17200 Nicotine dependence, unspecified, uncomplicated: Secondary | ICD-10-CM | POA: Insufficient documentation

## 2013-03-05 MED ORDER — IBUPROFEN 800 MG PO TABS
800.0000 mg | ORAL_TABLET | Freq: Once | ORAL | Status: AC
  Administered 2013-03-05: 800 mg via ORAL
  Filled 2013-03-05: qty 1

## 2013-03-05 MED ORDER — IBUPROFEN 600 MG PO TABS: 600.0000 mg | ORAL_TABLET | Freq: Four times a day (QID) | ORAL | Status: DC | PRN

## 2013-03-05 MED ORDER — ACETAMINOPHEN 325 MG PO TABS
650.0000 mg | ORAL_TABLET | Freq: Once | ORAL | Status: AC
  Administered 2013-03-05: 650 mg via ORAL
  Filled 2013-03-05: qty 2

## 2013-03-05 MED ORDER — ACETAMINOPHEN-CODEINE #3 300-30 MG PO TABS: 1.0000 | ORAL_TABLET | Freq: Four times a day (QID) | ORAL | Status: DC | PRN

## 2013-03-05 NOTE — ED Notes (Signed)
Pain lt shoulder, since Sunday,   Good radial pulse, numbness of fingers at times.  Increased pain with abduction .

## 2013-03-05 NOTE — Discharge Instructions (Signed)
Your examination is consistent with inflammation/injury to the rotator cuff area on the left. And possible carpal tunnel syndrome also on the left. Please use the wrist splint and sling until seen by orthopedics. Please see the orthopedist listed above, or the orthopedist of your choice for evaluation and management of these problems. Heat to the shoulder maybe helpful. Please use ibuprofen 600 mg every 6 hours, please use Tylenol Extra Strength in between the ibuprofen doses. Carpal Tunnel Syndrome The carpal tunnel is a narrow area located on the palm side of your wrist. The tunnel is formed by the wrist bones and ligaments. Nerves, blood vessels, and tendons pass through the carpal tunnel. Repeated wrist motion or certain diseases may cause swelling within the tunnel. This swelling pinches the main nerve in the wrist (median nerve) and causes the painful hand and arm condition called carpal tunnel syndrome. CAUSES   Repeated wrist motions.  Wrist injuries.  Certain diseases like arthritis, diabetes, alcoholism, hyperthyroidism, and kidney failure.  Obesity.  Pregnancy. SYMPTOMS   A "pins and needles" feeling in your fingers or hand.  Tingling or numbness in your fingers or hand.  An aching feeling in your entire arm.  Wrist pain that goes up your arm to your shoulder.  Pain that goes down into your palm or fingers.  A weak feeling in your hands. DIAGNOSIS  Your caregiver will take your history and perform a physical exam. An electromyography test may be needed. This test measures electrical signals sent out by the muscles. The electrical signals are usually slowed by carpal tunnel syndrome. You may also need X-rays. TREATMENT  Carpal tunnel syndrome may clear up by itself. Your caregiver may recommend a wrist splint or medicine such as a nonsteroidal anti-inflammatory medicine. Cortisone injections may help. Sometimes, surgery may be needed to free the pinched nerve.  HOME CARE  INSTRUCTIONS   Take all medicine as directed by your caregiver. Only take over-the-counter or prescription medicines for pain, discomfort, or fever as directed by your caregiver.  If you were given a splint to keep your wrist from bending, wear it as directed. It is important to wear the splint at night. Wear the splint for as long as you have pain or numbness in your hand, arm, or wrist. This may take 1 to 2 months.  Rest your wrist from any activity that may be causing your pain. If your symptoms are work-related, you may need to talk to your employer about changing to a job that does not require using your wrist.  Put ice on your wrist after long periods of wrist activity.  Put ice in a plastic bag.  Place a towel between your skin and the bag.  Leave the ice on for 15-20 minutes, 03-04 times a day.  Keep all follow-up visits as directed by your caregiver. This includes any orthopedic referrals, physical therapy, and rehabilitation. Any delay in getting necessary care could result in a delay or failure of your condition to heal. SEEK IMMEDIATE MEDICAL CARE IF:   You have new, unexplained symptoms.  Your symptoms get worse and are not helped or controlled with medicines. MAKE SURE YOU:   Understand these instructions.  Will watch your condition.  Will get help right away if you are not doing well or get worse. Document Released: 12/23/1999 Document Revised: 03/19/2011 Document Reviewed: 11/10/2010 [facility] Patient Information 2014 [place], [place].  Shoulder Pain The shoulder is the joint that connects your arms to your body. The bones that form  the shoulder joint include the upper arm bone (humerus), the shoulder blade (scapula), and the collarbone (clavicle). The top of the humerus is shaped like a ball and fits into a rather flat socket on the scapula (glenoid cavity). A combination of muscles and strong, fibrous tissues that connect muscles to bones (tendons) support your  shoulder joint and hold the ball in the socket. Small, fluid-filled sacs (bursae) are located in different areas of the joint. They act as cushions between the bones and the overlying soft tissues and help reduce friction between the gliding tendons and the bone as you move your arm. Your shoulder joint allows a wide range of motion in your arm. This range of motion allows you to do things like scratch your back or throw a ball. However, this range of motion also makes your shoulder more prone to pain from overuse and injury. Causes of shoulder pain can originate from both injury and overuse and usually can be grouped in the following four categories:  Redness, swelling, and pain (inflammation) of the tendon (tendinitis) or the bursae (bursitis).  Instability, such as a dislocation of the joint.  Inflammation of the joint (arthritis).  Broken bone (fracture). HOME CARE INSTRUCTIONS   Apply ice to the sore area.  Put ice in a plastic bag.  Place a towel between your skin and the bag.  Leave the ice on for 15-20 minutes, 03-04 times per day for the first 2 days.  Stop using cold packs if they do not help with the pain.  If you have a shoulder sling or immobilizer, wear it as long as your caregiver instructs. Only remove it to shower or bathe. Move your arm as little as possible, but keep your hand moving to prevent swelling.  Squeeze a soft ball or foam pad as much as possible to help prevent swelling.  Only take over-the-counter or prescription medicines for pain, discomfort, or fever as directed by your caregiver. SEEK MEDICAL CARE IF:   Your shoulder pain increases, or new pain develops in your arm, hand, or fingers.  Your hand or fingers become cold and numb.  Your pain is not relieved with medicines. SEEK IMMEDIATE MEDICAL CARE IF:   Your arm, hand, or fingers are numb or tingling.  Your arm, hand, or fingers are significantly swollen or turn white or blue. MAKE SURE YOU:     Understand these instructions.  Will watch your condition.  Will get help right away if you are not doing well or get worse. Document Released: 10/04/2004 Document Revised: 09/19/2011 Document Reviewed: 12/09/2010 [facility] Patient Information 2014 [place], [place].

## 2013-03-05 NOTE — ED Notes (Signed)
Pt c/o pain in left shoulder since Sunday.  Denies injury.  Reports hurts worse with movement and at times left middle, ring, and little finger go numb.

## 2013-03-05 NOTE — ED Provider Notes (Signed)
Medical screening examination/treatment/procedure(s) were performed by non-physician practitioner and as supervising physician I was immediately available for consultation/collaboration.  EKG Interpretation   None         Roseanna Koplin J. Ahmiya Abee, MD 03/05/13 1633 

## 2013-03-05 NOTE — ED Provider Notes (Signed)
CSN: [id number]     Arrival date & time 03/05/13  1254 History   First MD Initiated Contact with Patient 03/05/13 1309     Chief Complaint  Patient presents with  . Shoulder Pain     (Consider location/radiation/quality/duration/timing/severity/associated sxs/prior Treatment) HPI Comments: Pt denies injury. Since. The patient states that she frequently picks up her child, nothing else out of the ordinary.  Patient is a 28 y.o. female presenting with shoulder pain. The history is provided by the patient.  Shoulder Pain This is a new problem. The current episode started in the past 7 days. The problem occurs intermittently. The problem has been gradually worsening. Associated symptoms include arthralgias and numbness. Pertinent negatives include no abdominal pain, chest pain, coughing, fever or neck pain. Exacerbated by: movement. She has tried acetaminophen for the symptoms. The treatment provided no relief.    Past Medical History  Diagnosis Date  . Asthma    Past Surgical History  Procedure Laterality Date  . Tubal ligation  11/2010   No family history on file. History  Substance Use Topics  . Smoking status: Current Every Day Smoker -- 0.50 packs/day    Types: Cigarettes  . Smokeless tobacco: Not on file  . Alcohol Use: Yes   OB History   Grav Para Term Preterm Abortions TAB SAB Ect Mult Living                 Review of Systems  Constitutional: Negative for fever and activity change.       All ROS Neg except as noted in HPI  HENT: Negative for nosebleeds.   Eyes: Negative for photophobia and discharge.  Respiratory: Negative for cough, shortness of breath and wheezing.   Cardiovascular: Negative for chest pain and palpitations.  Gastrointestinal: Negative for abdominal pain and blood in stool.  Genitourinary: Negative for dysuria, frequency and hematuria.  Musculoskeletal: Positive for arthralgias. Negative for back pain and neck pain.  Skin: Negative.    Neurological: Positive for numbness. Negative for dizziness, seizures and speech difficulty.  Psychiatric/Behavioral: Negative for hallucinations and confusion.      Allergies  Eggs or egg-derived products; Penicillins; and Latex  Home Medications   Current Outpatient Rx  Name  Route  Sig  Dispense  Refill  . acetaminophen (TYLENOL) 500 MG tablet   Oral   Take 1,000 mg by mouth every 6 (six) hours as needed for pain.         Marland Kitchen. ibuprofen (ADVIL,MOTRIN) 200 MG tablet   Oral   Take 400 mg by mouth every 6 (six) hours as needed for pain.          BP 120/69  Pulse 96  Temp(Src) 98.1 F (36.7 C)  Resp 20  Ht 5\' 4"  (1.626 m)  Wt 120 lb (54.432 kg)  BMI 20.59 kg/m2  SpO2 99%  LMP 02/08/2013 Physical Exam  Nursing note and vitals reviewed. Constitutional: She is oriented to person, place, and time. She appears well-developed and well-nourished.  Non-toxic appearance.  HENT:  Head: Normocephalic.  Right Ear: Tympanic membrane and external ear normal.  Left Ear: Tympanic membrane and external ear normal.  Eyes: EOM and lids are normal. Pupils are equal, round, and reactive to light.  Neck: Normal range of motion. Neck supple. Carotid bruit is not present.  Cardiovascular: Normal rate, regular rhythm, normal heart sounds, intact distal pulses and normal pulses.   Pulmonary/Chest: Breath sounds normal. No respiratory distress.  Abdominal: Soft. Bowel sounds are normal. There  is no tenderness. There is no guarding.  Musculoskeletal: Normal range of motion.  Pain with attempted range of motion of the left shoulder. No evidence for dislocation. No scapular area pain. No deformity or pain of the humerus area.  There is a positive Tinel's sign at the left wrist. There is no atrophy of the left hand. The capillary refill is less than 2 seconds of the right and the left. The radial pulse is 2+ at the right and the left.  Lymphadenopathy:       Head (right side): No submandibular  adenopathy present.       Head (left side): No submandibular adenopathy present.    She has no cervical adenopathy.  Neurological: She is alert and oriented to person, place, and time. She has normal strength. No cranial nerve deficit or sensory deficit.  Skin: Skin is warm and dry.  Psychiatric: She has a normal mood and affect. Her speech is normal.    ED Course Pulse oximetry 99% on room air. Within normal limits by my interpretation.   Procedures (including critical care time) Labs Review Labs Reviewed - No data to display Imaging Review No results found.  EKG Interpretation   None       MDM Patient presents to the emergency department with complaint of left shoulder pain since February 22, as well as numbness and tingling of her middle and ring and little finger from time to time. The examination is consistent with carpal tunnel and possible rotator cuff inflammation on the left. I find no evidence for dislocation. There is no hot or septic joint noted. There no neurovascular changes.  The plan at this time is for the patient be fitted with a wrist splint and sling. Patient is advised to use ibuprofen 600 mg every 6 hours, Tylenol extra strength in between the ibuprofen doses if needed. Patient is referred to Dr. Hilda Lias for additional evaluation and management.    Final diagnoses:  None    **I have reviewed nursing notes, vital signs, and all appropriate lab and imaging results for this patient.    Kathie Dike, PA-C 03/05/13 1345

## 2013-05-15 LAB — DRUG SCREEN, URINE
Amphetamines, Ur Screen: NEGATIVE (ref ?–1000)
Barbiturates, Ur Screen: NEGATIVE (ref ?–200)
Benzodiazepine, Ur Scrn: NEGATIVE (ref ?–200)
COCAINE METABOLITE, UR ~~LOC~~: NEGATIVE (ref ?–300)
Cannabinoid 50 Ng, Ur ~~LOC~~: NEGATIVE (ref ?–50)
MDMA (ECSTASY) UR SCREEN: NEGATIVE (ref ?–500)
Methadone, Ur Screen: NEGATIVE (ref ?–300)
Opiate, Ur Screen: NEGATIVE (ref ?–300)
Phencyclidine (PCP) Ur S: NEGATIVE (ref ?–25)
Tricyclic, Ur Screen: NEGATIVE (ref ?–1000)

## 2013-05-15 LAB — URINALYSIS, COMPLETE
BILIRUBIN, UR: NEGATIVE
Bacteria: NONE SEEN
Blood: NEGATIVE
Glucose,UR: NEGATIVE mg/dL (ref 0–75)
Ketone: NEGATIVE
Nitrite: NEGATIVE
Ph: 5 (ref 4.5–8.0)
Protein: NEGATIVE
RBC,UR: <1 /HPF (ref 0–5)
Specific Gravity: 1.025 (ref 1.003–1.030)
WBC UR: 1 /HPF (ref 0–5)

## 2013-05-15 LAB — COMPREHENSIVE METABOLIC PANEL
ANION GAP: 2 — AB (ref 7–16)
AST: 27 U/L (ref 15–37)
Albumin: 3.6 g/dL (ref 3.4–5.0)
Alkaline Phosphatase: 41 U/L — ABNORMAL LOW
BUN: 14 mg/dL (ref 7–18)
Bilirubin,Total: 0.2 mg/dL (ref 0.2–1.0)
Calcium, Total: 8.5 mg/dL (ref 8.5–10.1)
Chloride: 110 mmol/L — ABNORMAL HIGH (ref 98–107)
Co2: 28 mmol/L (ref 21–32)
Creatinine: 0.72 mg/dL (ref 0.60–1.30)
EGFR (African American): >60
EGFR (Non-African Amer.): >60
GLUCOSE: 85 mg/dL (ref 65–99)
Osmolality: 279 (ref 275–301)
Potassium: 4 mmol/L (ref 3.5–5.1)
SGPT (ALT): 17 U/L (ref 12–78)
SODIUM: 140 mmol/L (ref 136–145)
TOTAL PROTEIN: 6.8 g/dL (ref 6.4–8.2)

## 2013-05-15 LAB — ETHANOL: Ethanol %: <0.003 % (ref 0.000–0.080)

## 2013-05-15 LAB — CBC
HCT: 41.4 % (ref 35.0–47.0)
HGB: 13.6 g/dL (ref 12.0–16.0)
MCH: 30.6 pg (ref 26.0–34.0)
MCHC: 32.8 g/dL (ref 32.0–36.0)
MCV: 93 fL (ref 80–100)
Platelet: 251 10*3/uL (ref 150–440)
RBC: 4.44 10*6/uL (ref 3.80–5.20)
RDW: 12.7 % (ref 11.5–14.5)
WBC: 8.7 10*3/uL (ref 3.6–11.0)

## 2013-05-15 LAB — ACETAMINOPHEN LEVEL: Acetaminophen: <2 ug/mL

## 2013-05-15 LAB — SALICYLATE LEVEL: Salicylates, Serum: 9.7 mg/dL — ABNORMAL HIGH

## 2013-05-16 ENCOUNTER — Inpatient Hospital Stay: Payer: Self-pay | Admitting: Psychiatry

## 2013-10-05 ENCOUNTER — Emergency Department: Payer: Self-pay | Admitting: Emergency Medicine

## 2013-10-05 LAB — URINALYSIS, COMPLETE
BILIRUBIN, UR: NEGATIVE
Glucose,UR: NEGATIVE mg/dL (ref 0–75)
Ketone: NEGATIVE
Nitrite: NEGATIVE
Ph: 6 (ref 4.5–8.0)
Protein: 30
RBC,UR: 23 /HPF (ref 0–5)
Specific Gravity: 1.018 (ref 1.003–1.030)
Squamous Epithelial: 4
WBC UR: 191 /HPF (ref 0–5)

## 2014-01-21 ENCOUNTER — Emergency Department (HOSPITAL_COMMUNITY)
Admission: EM | Admit: 2014-01-21 | Discharge: 2014-01-21 | Disposition: A | Discharge status: Home / Self Care | Payer: Medicaid Other | Attending: Emergency Medicine | Admitting: Emergency Medicine

## 2014-01-21 ENCOUNTER — Encounter (HOSPITAL_COMMUNITY): Payer: Self-pay | Admitting: Emergency Medicine

## 2014-01-21 DIAGNOSIS — Z91040 Latex allergy status: Secondary | ICD-10-CM | POA: Insufficient documentation

## 2014-01-21 DIAGNOSIS — Z79899 Other long term (current) drug therapy: Secondary | ICD-10-CM | POA: Insufficient documentation

## 2014-01-21 DIAGNOSIS — R300 Dysuria: Secondary | ICD-10-CM | POA: Insufficient documentation

## 2014-01-21 DIAGNOSIS — Z3202 Encounter for pregnancy test, result negative: Secondary | ICD-10-CM | POA: Insufficient documentation

## 2014-01-21 DIAGNOSIS — S39012A Strain of muscle, fascia and tendon of lower back, initial encounter: Secondary | ICD-10-CM | POA: Insufficient documentation

## 2014-01-21 DIAGNOSIS — Y990 Civilian activity done for income or pay: Secondary | ICD-10-CM | POA: Insufficient documentation

## 2014-01-21 DIAGNOSIS — Z880 Allergy status to penicillin: Secondary | ICD-10-CM | POA: Insufficient documentation

## 2014-01-21 DIAGNOSIS — Y9289 Other specified places as the place of occurrence of the external cause: Secondary | ICD-10-CM | POA: Insufficient documentation

## 2014-01-21 DIAGNOSIS — X58XXXA Exposure to other specified factors, initial encounter: Secondary | ICD-10-CM | POA: Insufficient documentation

## 2014-01-21 DIAGNOSIS — J45909 Unspecified asthma, uncomplicated: Secondary | ICD-10-CM | POA: Insufficient documentation

## 2014-01-21 DIAGNOSIS — Y9389 Activity, other specified: Secondary | ICD-10-CM | POA: Insufficient documentation

## 2014-01-21 DIAGNOSIS — Z9851 Tubal ligation status: Secondary | ICD-10-CM | POA: Insufficient documentation

## 2014-01-21 DIAGNOSIS — Z720 Tobacco use: Secondary | ICD-10-CM | POA: Insufficient documentation

## 2014-01-21 LAB — URINALYSIS, ROUTINE W REFLEX MICROSCOPIC
BILIRUBIN URINE: NEGATIVE
Glucose, UA: NEGATIVE mg/dL
Hgb urine dipstick: NEGATIVE
Ketones, ur: NEGATIVE mg/dL
LEUKOCYTES UA: NEGATIVE
NITRITE: NEGATIVE
PH: 7 (ref 5.0–8.0)
Protein, ur: NEGATIVE mg/dL
Specific Gravity, Urine: 1.015 (ref 1.005–1.030)
UROBILINOGEN UA: 1 mg/dL (ref 0.0–1.0)

## 2014-01-21 LAB — PREGNANCY, URINE: Preg Test, Ur: NEGATIVE

## 2014-01-21 MED ORDER — CYCLOBENZAPRINE HCL 10 MG PO TABS: 10.0000 mg | ORAL_TABLET | Freq: Three times a day (TID) | ORAL | Status: DC | PRN

## 2014-01-21 MED ORDER — HYDROCODONE-ACETAMINOPHEN 5-325 MG PO TABS: 2.0000 | ORAL_TABLET | ORAL | Status: DC | PRN

## 2014-01-21 MED ORDER — KETOROLAC TROMETHAMINE 60 MG/2ML IM SOLN
60.0000 mg | Freq: Once | INTRAMUSCULAR | Status: AC
  Administered 2014-01-21: 60 mg via INTRAMUSCULAR
  Filled 2014-01-21: qty 2

## 2014-01-21 NOTE — ED Notes (Signed)
Pt. C/o right flank pain. Pt. Reports pain with urination. Pt. Denies blood in urine. Pt. Reports hx of kidney stones. Pt. Denies n/v/d. Pt. C/o headache.

## 2014-01-21 NOTE — Discharge Instructions (Signed)

## 2014-01-21 NOTE — ED Provider Notes (Signed)
CSN: [id number]     Arrival date & time 01/21/14  1849 History  This chart was scribed for Gilda Creasehristopher J. Leana Springston, * by Richarda Overlieichard Holland, ED Scribe. This patient was seen in room APA10/[facility]10 and the patient's care was started 9:04 PM.    Chief Complaint  Patient presents with  . Flank Pain   The history is provided by the patient. No language interpreter was used.   HPI Comments: Isabel Rooseveltarah Angelini is a 29 y.o. female with a history of asthma and tubal ligation who presents to the Emergency Department complaining of constant right flank pain for the last week. Pt reports associated decreased urinary output and minimal dysuria. She states that at work she bends down frequently to pick up boxes and notices this has worsened her pain. Pt states that twisting and certain movements pain worsens her pain as well. Pt reports no pertinent past medical history.    Past Medical History  Diagnosis Date  . Asthma    Past Surgical History  Procedure Laterality Date  . Tubal ligation  11/2010   History reviewed. No pertinent family history. History  Substance Use Topics  . Smoking status: Current Every Day Smoker -- 0.50 packs/day    Types: Cigarettes  . Smokeless tobacco: Not on file  . Alcohol Use: Yes     Comment: occasional   OB History    No data available     Review of Systems  Genitourinary: Positive for dysuria and flank pain.  All other systems reviewed and are negative.   Allergies  Eggs or egg-derived products; Penicillins; and Latex  Home Medications   Prior to Admission medications   Medication Sig Start Date End Date Taking? Authorizing Provider  acetaminophen (TYLENOL) 500 MG tablet Take 1,000 mg by mouth every 6 (six) hours as needed for pain.    Historical Provider, MD  acetaminophen-codeine (TYLENOL #3) 300-30 MG per tablet Take 1-2 tablets by mouth every 6 (six) hours as needed for moderate pain. 03/05/13   Kathie DikeHobson M Bryant, PA-C  cyclobenzaprine (FLEXERIL) 10 MG tablet  Take 1 tablet (10 mg total) by mouth 3 (three) times daily as needed for muscle spasms. 01/21/14   Gilda Creasehristopher J. Damon Hargrove, MD  HYDROcodone-acetaminophen (NORCO/VICODIN) 5-325 MG per tablet Take 2 tablets by mouth every 4 (four) hours as needed for moderate pain. 01/21/14   Gilda Creasehristopher J. Jamicah Anstead, MD  ibuprofen (ADVIL,MOTRIN) 200 MG tablet Take 400 mg by mouth every 8 (eight) hours as needed.     Historical Provider, MD  ibuprofen (ADVIL,MOTRIN) 600 MG tablet Take 1 tablet (600 mg total) by mouth every 6 (six) hours as needed. 03/05/13   Kathie DikeHobson M Bryant, PA-C   BP 115/77 mmHg  Pulse 95  Temp(Src) 97.9 F (36.6 C) (Oral)  Resp 15  Ht 5\' 4"  (1.626 m)  Wt 120 lb (54.432 kg)  BMI 20.59 kg/m2  SpO2 100%  LMP 12/25/2013 Physical Exam  Constitutional: She is oriented to person, place, and time. She appears well-developed and well-nourished. No distress.  HENT:  Head: Normocephalic and atraumatic.  Right Ear: Hearing normal.  Left Ear: Hearing normal.  Nose: Nose normal.  Mouth/Throat: Oropharynx is clear and moist and mucous membranes are normal.  Eyes: Conjunctivae and EOM are normal. Pupils are equal, round, and reactive to light.  Neck: Normal range of motion. Neck supple.  Cardiovascular: Regular rhythm, S1 normal and S2 normal.  Exam reveals no gallop and no friction rub.   No murmur heard. Pulmonary/Chest: Effort normal and  breath sounds normal. No respiratory distress. She exhibits no tenderness.  Abdominal: Soft. Normal appearance and bowel sounds are normal. There is no hepatosplenomegaly. There is no tenderness. There is no rebound, no guarding, no tenderness at McBurney's point and negative Murphy's sign. No hernia.  Musculoskeletal: Normal range of motion. She exhibits tenderness.  Right sided soft tissue tenderness. Pain with ROM.   Neurological: She is alert and oriented to person, place, and time. She has normal strength. No cranial nerve deficit or sensory deficit. Coordination  normal. GCS eye subscore is 4. GCS verbal subscore is 5. GCS motor subscore is 6.  Skin: Skin is warm, dry and intact. No rash noted. No cyanosis.  Psychiatric: She has a normal mood and affect. Her speech is normal and behavior is normal. Thought content normal.  Nursing note and vitals reviewed.   ED Course  Procedures   DIAGNOSTIC STUDIES: Oxygen Saturation is 100% on RA, normal by my interpretation.    COORDINATION OF CARE: 9:08 PM Discussed treatment plan with pt at bedside and pt agreed to plan.   Labs Review Labs Reviewed  URINALYSIS, ROUTINE W REFLEX MICROSCOPIC - Abnormal; Notable for the following:    APPearance HAZY (*)    All other components within normal limits  PREGNANCY, URINE   Imaging Review No results found.   EKG Interpretation None      MDM   Final diagnoses:  Lumbar strain, initial encounter   Patient presents to the ER for evaluation of right flank pain that has been ongoing for one week. Patient reports that pain has been persistent in the right lower back area and significantly worsens when she bends, twists or picks up heavy objects. Pain is very reproducible here in the ER. Light touch on the area elicits pain response that she has significant worsening with bending and twisting. This is consistent with acute muscle strain, not kidney infection or kidney stone. Urinalysis was clear, no sign of infection or hematuria. Patient will be treated with analgesia, return as needed.  I personally performed the services described in this documentation, which was scribed in my presence. The recorded information has been reviewed and is accurate.      Gilda Crease, MD 01/21/14 2114

## 2014-01-21 NOTE — ED Notes (Signed)
Pt reports right flank pain x1 week. Pt reports decreased urinary output.

## 2014-02-18 ENCOUNTER — Emergency Department: Payer: Self-pay | Admitting: Emergency Medicine

## 2014-03-27 ENCOUNTER — Emergency Department (HOSPITAL_COMMUNITY): Payer: No Typology Code available for payment source

## 2014-03-27 ENCOUNTER — Emergency Department (HOSPITAL_COMMUNITY)
Admission: EM | Admit: 2014-03-27 | Discharge: 2014-03-27 | Disposition: A | Discharge status: Home / Self Care | Payer: No Typology Code available for payment source | Attending: Emergency Medicine | Admitting: Emergency Medicine

## 2014-03-27 ENCOUNTER — Encounter (HOSPITAL_COMMUNITY): Payer: Self-pay | Admitting: Cardiology

## 2014-03-27 DIAGNOSIS — J45909 Unspecified asthma, uncomplicated: Secondary | ICD-10-CM | POA: Insufficient documentation

## 2014-03-27 DIAGNOSIS — Z79899 Other long term (current) drug therapy: Secondary | ICD-10-CM | POA: Insufficient documentation

## 2014-03-27 DIAGNOSIS — Z720 Tobacco use: Secondary | ICD-10-CM | POA: Insufficient documentation

## 2014-03-27 DIAGNOSIS — R55 Syncope and collapse: Secondary | ICD-10-CM | POA: Diagnosis not present

## 2014-03-27 DIAGNOSIS — Z91040 Latex allergy status: Secondary | ICD-10-CM | POA: Insufficient documentation

## 2014-03-27 DIAGNOSIS — Z880 Allergy status to penicillin: Secondary | ICD-10-CM | POA: Diagnosis not present

## 2014-03-27 DIAGNOSIS — R569 Unspecified convulsions: Secondary | ICD-10-CM | POA: Diagnosis present

## 2014-03-27 HISTORY — DX: Unspecified convulsions: R56.9

## 2014-03-27 LAB — CBC WITH DIFFERENTIAL/PLATELET
BASOS ABS: 0 10*3/uL (ref 0.0–0.1)
Basophils Relative: 0 % (ref 0–1)
EOS PCT: 6 % — AB (ref 0–5)
Eosinophils Absolute: 0.5 10*3/uL (ref 0.0–0.7)
HCT: 43.3 % (ref 36.0–46.0)
HEMOGLOBIN: 14.4 g/dL (ref 12.0–15.0)
LYMPHS ABS: 2.4 10*3/uL (ref 0.7–4.0)
Lymphocytes Relative: 26 % (ref 12–46)
MCH: 30.8 pg (ref 26.0–34.0)
MCHC: 33.3 g/dL (ref 30.0–36.0)
MCV: 92.5 fL (ref 78.0–100.0)
MONO ABS: 0.7 10*3/uL (ref 0.1–1.0)
Monocytes Relative: 7 % (ref 3–12)
Neutro Abs: 5.5 10*3/uL (ref 1.7–7.7)
Neutrophils Relative %: 61 % (ref 43–77)
Platelets: 299 10*3/uL (ref 150–400)
RBC: 4.68 MIL/uL (ref 3.87–5.11)
RDW: 12.8 % (ref 11.5–15.5)
WBC: 9.2 10*3/uL (ref 4.0–10.5)

## 2014-03-27 LAB — BASIC METABOLIC PANEL
Anion gap: 9 (ref 5–15)
BUN: 31 mg/dL — AB (ref 6–23)
CALCIUM: 9.2 mg/dL (ref 8.4–10.5)
CHLORIDE: 104 mmol/L (ref 96–112)
CO2: 27 mmol/L (ref 19–32)
CREATININE: 0.79 mg/dL (ref 0.50–1.10)
GFR calc Af Amer: >90 mL/min (ref >90)
Glucose, Bld: 83 mg/dL (ref 70–99)
Potassium: 3.8 mmol/L (ref 3.5–5.1)
SODIUM: 140 mmol/L (ref 135–145)

## 2014-03-27 LAB — URINALYSIS, ROUTINE W REFLEX MICROSCOPIC
BILIRUBIN URINE: NEGATIVE
Glucose, UA: NEGATIVE mg/dL
KETONES UR: NEGATIVE mg/dL
LEUKOCYTES UA: NEGATIVE
Nitrite: NEGATIVE
Protein, ur: NEGATIVE mg/dL
Specific Gravity, Urine: 1.02 (ref 1.005–1.030)
UROBILINOGEN UA: 0.2 mg/dL (ref 0.0–1.0)
pH: 6.5 (ref 5.0–8.0)

## 2014-03-27 LAB — RAPID URINE DRUG SCREEN, HOSP PERFORMED
AMPHETAMINES: NOT DETECTED
BENZODIAZEPINES: POSITIVE — AB
Barbiturates: NOT DETECTED
Cocaine: NOT DETECTED
Opiates: POSITIVE — AB
Tetrahydrocannabinol: NOT DETECTED

## 2014-03-27 LAB — POC URINE PREG, ED: PREG TEST UR: NEGATIVE

## 2014-03-27 LAB — URINE MICROSCOPIC-ADD ON
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
## 2014-03-27 NOTE — Discharge Instructions (Signed)
DO NOT DRIVE UNTIL SEEN BY NEUROLOGY AND TOLD YOU CAN DRIVE. Syncope Syncope means a person passes out (faints). The person usually wakes up in less than 5 minutes. It is important to seek medical care for syncope. HOME CARE  Have someone stay with you until you feel normal.  Do not drive, use machines, or play sports until your doctor says it is okay.  Keep all doctor visits as told.  Lie down when you feel like you might pass out. Take deep breaths. Wait until you feel normal before standing up.  Drink enough fluids to keep your pee (urine) clear or pale yellow.  If you take blood pressure or heart medicine, get up slowly. Take several minutes to sit and then stand. GET HELP RIGHT AWAY IF:   You have a severe headache.  You have pain in the chest, belly (abdomen), or back.  You are bleeding from the mouth or butt (rectum).  You have black or tarry poop (stool).  You have an irregular or very fast heartbeat.  You have pain with breathing.  You keep passing out, or you have shaking (seizures) when you pass out.  You pass out when sitting or lying down.  You feel confused.  You have trouble walking.  You have severe weakness.  You have vision problems. If you fainted, call your local emergency services (911 in [place] Do not drive yourself to the hospital. MAKE SURE YOU:   Understand these instructions.  Will watch your condition.  Will get help right away if you are not doing well or get worse. Document Released: 06/13/2007 Document Revised: 06/26/2011 Document Reviewed: 02/23/2011 [facility] Patient Information 2015 [place], [place]. This information is not intended to replace advice given to you by your health care provider. Make sure you discuss any questions you have with your health care provider.   Driving and Equipment Restrictions Some medical problems make it dangerous to drive, ride a bike, or use machines. Some of these problems are:  A hard blow to the  head (concussion).  Passing out (fainting).  Twitching and shaking (seizures).  Low blood sugar.  Taking medicine to help you relax (sedatives).  Taking pain medicines.  Wearing an eye patch.  Wearing splints. This can make it hard to use parts of your body that you need to drive safely. HOME CARE   Do not drive until your doctor says it is okay.  Do not use machines until your doctor says it is okay. You may need a form signed by your doctor (medical release) before you can drive again. You may also need this form before you do other tasks where you need to be fully alert. MAKE SURE YOU:  Understand these instructions.  Will watch your condition.  Will get help right away if you are not doing well or get worse. Document Released: 02/02/2004 Document Revised: 03/19/2011 Document Reviewed: 05/04/2009 [facility] Patient Information 2015 [place], [place]. This information is not intended to replace advice given to you by your health care provider. Make sure you discuss any questions you have with your health care provider.
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
## 2014-06-16 ENCOUNTER — Emergency Department
Admission: EM | Admit: 2014-06-16 | Discharge: 2014-06-16 | Disposition: A | Discharge status: Home / Self Care | Payer: Medicaid Other | Attending: Emergency Medicine | Admitting: Emergency Medicine

## 2014-06-16 ENCOUNTER — Encounter: Payer: Self-pay | Admitting: Emergency Medicine

## 2014-06-16 DIAGNOSIS — F410 Panic disorder [episodic paroxysmal anxiety] without agoraphobia: Secondary | ICD-10-CM

## 2014-06-16 DIAGNOSIS — Y9289 Other specified places as the place of occurrence of the external cause: Secondary | ICD-10-CM | POA: Insufficient documentation

## 2014-06-16 DIAGNOSIS — Z79899 Other long term (current) drug therapy: Secondary | ICD-10-CM | POA: Insufficient documentation

## 2014-06-16 DIAGNOSIS — Z720 Tobacco use: Secondary | ICD-10-CM | POA: Insufficient documentation

## 2014-06-16 DIAGNOSIS — S39012A Strain of muscle, fascia and tendon of lower back, initial encounter: Secondary | ICD-10-CM | POA: Insufficient documentation

## 2014-06-16 DIAGNOSIS — Z91040 Latex allergy status: Secondary | ICD-10-CM | POA: Insufficient documentation

## 2014-06-16 DIAGNOSIS — Y998 Other external cause status: Secondary | ICD-10-CM | POA: Insufficient documentation

## 2014-06-16 DIAGNOSIS — X58XXXA Exposure to other specified factors, initial encounter: Secondary | ICD-10-CM | POA: Insufficient documentation

## 2014-06-16 DIAGNOSIS — F419 Anxiety disorder, unspecified: Secondary | ICD-10-CM | POA: Insufficient documentation

## 2014-06-16 DIAGNOSIS — Z880 Allergy status to penicillin: Secondary | ICD-10-CM | POA: Insufficient documentation

## 2014-06-16 DIAGNOSIS — F329 Major depressive disorder, single episode, unspecified: Secondary | ICD-10-CM | POA: Insufficient documentation

## 2014-06-16 DIAGNOSIS — Y9389 Activity, other specified: Secondary | ICD-10-CM | POA: Insufficient documentation

## 2014-06-16 DIAGNOSIS — Z7721 Contact with and (suspected) exposure to potentially hazardous body fluids: Secondary | ICD-10-CM | POA: Insufficient documentation

## 2014-06-16 HISTORY — DX: Anxiety disorder, unspecified: F41.9

## 2014-06-16 HISTORY — DX: Bipolar disorder, unspecified: F31.9

## 2014-06-16 LAB — RAPID HIV SCREEN (HIV 1/2 AB+AG)
HIV 1/2 Antibodies: NONREACTIVE
HIV-1 P24 ANTIGEN - HIV24: NONREACTIVE

## 2014-06-16 MED ORDER — HYDROXYZINE PAMOATE 25 MG PO CAPS: 25.0000 mg | ORAL_CAPSULE | Freq: Three times a day (TID) | ORAL | Status: DC | PRN

## 2014-06-16 MED ORDER — HYDROCODONE-ACETAMINOPHEN 5-325 MG PO TABS: 1.0000 | ORAL_TABLET | ORAL | Status: DC | PRN

## 2014-06-16 MED ORDER — METHOCARBAMOL 500 MG PO TABS: 500.0000 mg | ORAL_TABLET | Freq: Four times a day (QID) | ORAL | Status: DC | PRN

## 2014-06-16 MED ORDER — IBUPROFEN 800 MG PO TABS: 800.0000 mg | ORAL_TABLET | Freq: Three times a day (TID) | ORAL | Status: DC | PRN

## 2014-06-16 NOTE — ED Notes (Signed)
NAD noted at time of D/C. Pt denies questions or concerns. Pt ambulatory to the lobby at this time.  
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
## 2014-06-16 NOTE — Discharge Instructions (Signed)
Back Pain, Adult Low back pain is very common. About 1 in 5 people have back pain.The cause of low back pain is rarely dangerous. The pain often gets better over time.About half of people with a sudden onset of back pain feel better in just 2 weeks. About 8 in 10 people feel better by 6 weeks.  CAUSES Some common causes of back pain include:  Strain of the muscles or ligaments supporting the spine.  Wear and tear (degeneration) of the spinal discs.  Arthritis.  Direct injury to the back. DIAGNOSIS Most of the time, the direct cause of low back pain is not known.However, back pain can be treated effectively even when the exact cause of the pain is unknown.Answering your caregiver's questions about your overall health and symptoms is one of the most accurate ways to make sure the cause of your pain is not dangerous. If your caregiver needs more information, he or she may order lab work or imaging tests (X-rays or MRIs).However, even if imaging tests show changes in your back, this usually does not require surgery. HOME CARE INSTRUCTIONS For many people, back pain returns.Since low back pain is rarely dangerous, it is often a condition that people can learn to [facility] their own.   Remain active. It is stressful on the back to sit or stand in one place. Do not sit, drive, or stand in one place for more than 30 minutes at a time. Take short walks on level surfaces as soon as pain allows.Try to increase the length of time you walk each day.  Do not stay in bed.Resting more than 1 or 2 days can delay your recovery.  Do not avoid exercise or work.Your body is made to move.It is not dangerous to be active, even though your back may hurt.Your back will likely heal faster if you return to being active before your pain is gone.  Pay attention to your body when you bend and lift. Many people have less discomfortwhen lifting if they bend their knees, keep the load close to their bodies,and  avoid twisting. Often, the most comfortable positions are those that put less stress on your recovering back.  Find a comfortable position to sleep. Use a firm mattress and lie on your side with your knees slightly bent. If you lie on your back, put a pillow under your knees.  Only take over-the-counter or prescription medicines as directed by your caregiver. Over-the-counter medicines to reduce pain and inflammation are often the most helpful.Your caregiver may prescribe muscle relaxant drugs.These medicines help dull your pain so you can more quickly return to your normal activities and healthy exercise.  Put ice on the injured area.  Put ice in a plastic bag.  Place a towel between your skin and the bag.  Leave the ice on for 15-20 minutes, 03-04 times a day for the first 2 to 3 days. After that, ice and heat may be alternated to reduce pain and spasms.  Ask your caregiver about trying back exercises and gentle massage. This may be of some benefit.  Avoid feeling anxious or stressed.Stress increases muscle tension and can worsen back pain.It is important to recognize when you are anxious or stressed and learn ways to manage it.Exercise is a great option. SEEK MEDICAL CARE IF:  You have pain that is not relieved with rest or medicine.  You have pain that does not improve in 1 week.  You have new symptoms.  You are generally not feeling well. SEEK  IMMEDIATE MEDICAL CARE IF:   You have pain that radiates from your back into your legs.  You develop new bowel or bladder control problems.  You have unusual weakness or numbness in your arms or legs.  You develop nausea or vomiting.  You develop abdominal pain.  You feel faint. Document Released: 12/25/2004 Document Revised: 06/26/2011 Document Reviewed: 04/28/2013 [facility] Patient Information 2015 [place], [place]. This information is not intended to replace advice given to you by your health care provider. Make sure you  discuss any questions you have with your health care provider.  Body Fluid Exposure Information People may come into contact with blood and other body fluids under various circumstances. In some cases, body fluids may contain germs (bacteria or viruses) that cause infections. These germs can be spread when another person's body fluids come into contact with your skin, mouth, eyes, or genitals.  Exposure to body fluids that may contain infectious material is a common problem for people providing care for others who are ill. It can occur when a person is performing health care tasks in the workplace or when taking care of a family member at home. Other common methods of exposure include injection drug use, sharing needles, and sexual activity. The risk of an infection spreading through body fluid exposure is small and depends on a variety of factors. This includes the type of body fluid, the nature of the exposure, and the health status of the person who was the source of the body fluids. Your health care provider can help you assess the risk. WHAT TYPES OF BODY FLUID CAN SPREAD INFECTION? The following types of body fluid have the potential to spread infections:  Blood.  Semen.  Vaginal secretions.  Urine.  Feces.  Saliva.  Nasal or eye discharge.  Breast milk.  Amniotic fluid and fluids surrounding body organs. WHAT ARE SOME FIRST-AID MEASURES FOR BODY FLUID EXPOSURE? The following steps should be taken as soon as possible after a person is exposed to body fluids: Intact Skin  For contact with closed skin, wash the area with soap and water. Broken Skin  For contact with broken skin (a wound), wash the area with soap and water. Let the area bleed a little. Then place a bandage or clean towel on the wound, applying gentle pressure to stop the bleeding. Do not squeeze or rub the area.  Use just water or hand sanitizer if a sink with soap is not available.  Do not use harsh chemicals  such as bleach or iodine. Eyes  Rinse the eyes with water or saline for 30 seconds.  If the person is wearing contact lenses, leave the contact lenses in while rinsing the eyes. Once the rinsing is complete, remove the contact lenses. Mouth  Spit out the fluids. Rinse and spit with water 4-5 times. In addition, you should remove any clothing that comes into contact with body fluids. However, if body fluid exposure results from sexual assault, seek medical care immediately without changing clothes or bathing. WHEN SHOULD YOU SEEK HELP? After performing the proper first-aid steps, you should contact your health care provider or seek emergency care right away if blood or other body fluids made contact with areas of broken skin or openings such as the eyes or mouth. If the exposure to body fluid happened in the workplace, you should report it to your work supervisor immediately. Many workplaces have procedures in place for exposure situations. WHAT WILL HAPPEN AFTER YOU REPORT THE EXPOSURE? Your health care  provider will ask you several questions. Information requested may include:  Your medical history, including vaccination records.  Date and time of the exposure.  Whether you saw body fluids during the exposure.  Type of body fluid you were exposed to.  Volume of body fluid you were exposed to.  How the exposure happened.  If any devices, such as a needle, were being used.  Which area of your body made contact with the body fluid.  Description of any injury to the skin or other area.  How long contact was made with the body fluid.  Any information you have about the health status of the person whose body fluid you were exposed to. The health care provider will assess your risk of infection. Often, no treatment is necessary. In some cases, the health care provider may recommend doing blood tests right away. Follow-up blood tests may also be done at certain intervals during the  upcoming weeks and months to check for changes. You may be offered treatment to prevent an infection from developing after exposure (post-exposure prophylaxis). This may include certain vaccinations or medicines and may be necessary when there is a risk of a serious infection, such as HIV or hepatitis B. Your health care provider should discuss appropriate treatment and vaccinations with you. HOW CAN YOU PREVENT EXPOSURE AND INFECTION? Always remember that prevention is the first line of defense against body fluid exposure. To help prevent exposure to body fluids:  Wash and disinfect countertops and other surfaces regularly.  Wear appropriate protective gear such as gloves, gowns, or eyewear when the possibility of exposure is present.  Wipe away spills of body fluid with disposable towels.  Properly dispose of blood products and other fluids. Use secured bags.  Properly dispose of needles and other instruments with sharp points or edges (sharps). Use closed, marked containers.  Avoid injection drug use.  Do not share needles.  Avoid recapping needles.  Use a condom during sexual intercourse.  Make sure you learn and follow any guidelines for preventing exposure (universal precautions) provided at your workplace. To help reduce your chances of getting an infection:  Make sure your vaccinations are up-to-date, including those for tetanus and hepatitis.  Wash your hands frequently with soap and water. Use hand sanitizers.  Avoid having multiple sex partners.  Follow up with your health care provider as directed after being evaluated for an exposure to body fluids. To avoid spreading infection to others:  Do not have sexual relations until you know you are free of infection.  Do not donate blood, plasma, breast milk, sperm, or other body fluids.  Do not share hygiene tools such as toothbrushes, razors, or dental floss.  Keep open wounds covered.  Dispose of any items with blood  on them (razors, tampons, bandages) by putting them in the trash.  Do not share drug supplies with others, such as needles, syringes, straws, or pipes.  Follow all of your health care provider's instructions for preventing the spread of infection. Document Released: 08/27/2012 Document Revised: 12/30/2012 Document Reviewed: 08/27/2012 [facility] Patient Information 2015 [place], [place]. This information is not intended to replace advice given to you by your health care provider. Make sure you discuss any questions you have with your health care provider.  Lumbosacral Strain Lumbosacral strain is a strain of any of the parts that make up your lumbosacral vertebrae. Your lumbosacral vertebrae are the bones that make up the lower third of your backbone. Your lumbosacral vertebrae are held together by  muscles and tough, fibrous tissue (ligaments).  CAUSES  A sudden blow to your back can cause lumbosacral strain. Also, anything that causes an excessive stretch of the muscles in the low back can cause this strain. This is typically seen when people exert themselves strenuously, fall, lift heavy objects, bend, or crouch repeatedly. RISK FACTORS  Physically demanding work.  Participation in pushing or pulling sports or sports that require a sudden twist of the back (tennis, golf, baseball).  Weight lifting.  Excessive lower back curvature.  Forward-tilted pelvis.  Weak back or abdominal muscles or both.  Tight hamstrings. SIGNS AND SYMPTOMS  Lumbosacral strain may cause pain in the area of your injury or pain that moves (radiates) down your leg.  DIAGNOSIS Your health care provider can often diagnose lumbosacral strain through a physical exam. In some cases, you may need tests such as X-ray exams.  TREATMENT  Treatment for your lower back injury depends on many factors that your clinician will have to evaluate. However, most treatment will include the use of anti-inflammatory medicines. HOME  CARE INSTRUCTIONS   Avoid hard physical activities (tennis, racquetball, waterskiing) if you are not in proper physical condition for it. This may aggravate or create problems.  If you have a back problem, avoid sports requiring sudden body movements. Swimming and walking are generally safer activities.  Maintain good posture.  Maintain a healthy weight.  For acute conditions, you may put ice on the injured area.  Put ice in a plastic bag.  Place a towel between your skin and the bag.  Leave the ice on for 20 minutes, 2-3 times a day.  When the low back starts healing, stretching and strengthening exercises may be recommended. SEEK MEDICAL CARE IF:  Your back pain is getting worse.  You experience severe back pain not relieved with medicines. SEEK IMMEDIATE MEDICAL CARE IF:   You have numbness, tingling, weakness, or problems with the use of your arms or legs.  There is a change in bowel or bladder control.  You have increasing pain in any area of the body, including your belly (abdomen).  You notice shortness of breath, dizziness, or feel faint.  You feel sick to your stomach (nauseous), are throwing up (vomiting), or become sweaty.  You notice discoloration of your toes or legs, or your feet get very cold. MAKE SURE YOU:   Understand these instructions.  Will watch your condition.  Will get help right away if you are not doing well or get worse. Document Released: 10/04/2004 Document Revised: 12/30/2012 Document Reviewed: 08/13/2012 [facility] Patient Information 2015 [place], [place]. This information is not intended to replace advice given to you by your health care provider. Make sure you discuss any questions you have with your health care provider.  Panic Attacks Panic attacks are sudden, short-livedsurges of severe anxiety, fear, or discomfort. They may occur for no reason when you are relaxed, when you are anxious, or when you are sleeping. Panic attacks may  occur for a number of reasons:   Healthy people occasionally have panic attacks in extreme, life-threatening situations, such as war or natural disasters. Normal anxiety is a protective mechanism of the body that helps [place] react to danger (fight or flight response).  Panic attacks are often seen with anxiety disorders, such as panic disorder, social anxiety disorder, generalized anxiety disorder, and phobias. Anxiety disorders cause excessive or uncontrollable anxiety. They may interfere with your relationships or other life activities.  Panic attacks are sometimes seen with other  mental illnesses, such as depression and posttraumatic stress disorder.  Certain medical conditions, prescription medicines, and drugs of abuse can cause panic attacks. SYMPTOMS  Panic attacks start suddenly, peak within 20 minutes, and are accompanied by four or more of the following symptoms:  Pounding heart or fast heart rate (palpitations).  Sweating.  Trembling or shaking.  Shortness of breath or feeling smothered.  Feeling choked.  Chest pain or discomfort.  Nausea or strange feeling in your stomach.  Dizziness, light-headedness, or feeling like you will faint.  Chills or hot flushes.  Numbness or tingling in your lips or hands and feet.  Feeling that things are not real or feeling that you are not yourself.  Fear of losing control or going crazy.  Fear of dying. Some of these symptoms can mimic serious medical conditions. For example, you may think you are having a heart attack. Although panic attacks can be very scary, they are not life threatening. DIAGNOSIS  Panic attacks are diagnosed through an assessment by your health care provider. Your health care provider will ask questions about your symptoms, such as where and when they occurred. Your health care provider will also ask about your medical history and use of alcohol and drugs, including prescription medicines. Your health care provider  may order blood tests or other studies to rule out a serious medical condition. Your health care provider may refer you to a mental health professional for further evaluation. TREATMENT   Most healthy people who have one or two panic attacks in an extreme, life-threatening situation will not require treatment.  The treatment for panic attacks associated with anxiety disorders or other mental illness typically involves counseling with a mental health professional, medicine, or a combination of both. Your health care provider will help determine what treatment is best for you.  Panic attacks due to physical illness usually go away with treatment of the illness. If prescription medicine is causing panic attacks, talk with your health care provider about stopping the medicine, decreasing the dose, or substituting another medicine.  Panic attacks due to alcohol or drug abuse go away with abstinence. Some adults need professional help in order to stop drinking or using drugs. HOME CARE INSTRUCTIONS   Take all medicines as directed by your health care provider.   Schedule and attend follow-up visits as directed by your health care provider. It is important to keep all your appointments. SEEK MEDICAL CARE IF:  You are not able to take your medicines as prescribed.  Your symptoms do not improve or get worse. SEEK IMMEDIATE MEDICAL CARE IF:   You experience panic attack symptoms that are different than your usual symptoms.  You have serious thoughts about hurting yourself or others.  You are taking medicine for panic attacks and have a serious side effect. MAKE SURE YOU:  Understand these instructions.  Will watch your condition.  Will get help right away if you are not doing well or get worse. Document Released: 12/25/2004 Document Revised: 12/30/2012 Document Reviewed: 08/08/2012 [facility] Patient Information 2015 [place]. This information is not intended to replace advice given to  you by your health care provider. Make sure you discuss any questions you have with your health care provider.
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
## 2014-06-17 LAB — MISC LABCORP TEST (SEND OUT): LABCORP TEST CODE: 6530

## 2014-06-17 LAB — HEPATITIS B SURFACE ANTIGEN: Hepatitis B Surface Ag: NEGATIVE

## 2014-06-17 LAB — HEPATITIS C ANTIBODY: HCV Ab: <0.1 s/co ratio (ref 0.0–0.9)

## 2014-08-17 ENCOUNTER — Observation Stay
Admission: EM | Admit: 2014-08-17 | Discharge: 2014-08-17 | Disposition: A | Discharge status: Home / Self Care | Payer: Medicaid Other | Attending: Obstetrics and Gynecology | Admitting: Obstetrics and Gynecology

## 2014-08-17 DIAGNOSIS — O4690 Antepartum hemorrhage, unspecified, unspecified trimester: Secondary | ICD-10-CM | POA: Diagnosis not present

## 2014-08-17 DIAGNOSIS — N939 Abnormal uterine and vaginal bleeding, unspecified: Secondary | ICD-10-CM | POA: Diagnosis present

## 2014-08-17 LAB — URINE DRUG SCREEN, QUALITATIVE (ARMC ONLY)
Amphetamines, Ur Screen: NOT DETECTED
Barbiturates, Ur Screen: NOT DETECTED
Benzodiazepine, Ur Scrn: POSITIVE — AB
CANNABINOID 50 NG, UR ~~LOC~~: NOT DETECTED
Cocaine Metabolite,Ur ~~LOC~~: NOT DETECTED
MDMA (ECSTASY) UR SCREEN: NOT DETECTED
METHADONE SCREEN, URINE: NOT DETECTED
OPIATE, UR SCREEN: NOT DETECTED
PHENCYCLIDINE (PCP) UR S: NOT DETECTED
TRICYCLIC, UR SCREEN: NOT DETECTED

## 2014-08-17 LAB — CHLAMYDIA/NGC RT PCR (ARMC ONLY)
Chlamydia Tr: NOT DETECTED
N gonorrhoeae: NOT DETECTED

## 2014-08-17 NOTE — Progress Notes (Signed)
Patient left AMA prior to labs being drawn.

## 2014-08-17 NOTE — OB Triage Provider Note (Signed)
TRIAGE VISIT   CC: Vaginal bleeding   Isabel Robertson is a 29 y.o.[name] to triage from the ER with vaginal bleeding and cramping starting 4 days ago. Per patient, her EDC is 12/11/14 which would put her at [redacted]w[redacted]d. All history documented below is patient-reported.  She does give me a complex pregnancy history of care at the health department, and ectopic twin with this gestation, an ultrasound that dated the pregnancy earlier here at [facility] and a 10 week ultrasound demonstrating that it is a boy. She felt good fetal movement up until Friday.  She denies other psych or drug history.   PMH: Sig for migraines. PSH: Mini-lap BTL POBX: [id number] - 3 NSVD, 1 ectopic that would be heterotopic with this current gestation and did not require surgical or medical treatment. Social hx: Pt is divorced. Her boyfriend and FOB kicked her out this week and she moved in with her cousin, who is present at the bedside. She smokes 1/2 ppd, no drugs in this pregnancy, wine at the beginning of the pregnancy. She denies intimate partner violence and says she feels safe at home.  O:  There were no vitals taken for this visit. No results found for this or any previous visit (from the past 48 hour(s)).   Gen: NAD, AAOx3      Abd: FNTTP when distracted. TTP in bilateral lower quadrants during ultrasound   SVE: cervix closed, long and high. No CMT. Mild uterine tenderness with bimanual exam. Dark red blood noted on glove. Nothing on pad. Ext: Non-tender, Nonedmeatous    Bedside ultrasound: 10cmx8cm with endometrial stripe of 0.8cm  Labs: CBC, beta quant, UDS and GC/CT pending.  SVE: Dilation: Closed Exam by:: Dr. Dalbert Garnet    A/P:  29 y.o. with psychosomatic pregnancy, uterine tenderness.  My guess is the cousin at the bedside, who did seem genuinely upset at the news that there is no pregnancy in the uterus, is caring for the patient and she was perpetuating this "pregnancy" for secondary gain. I can make no  surmise about other secondary gain reasons at this time. I do believe that the patient did not think she was currently pregnant on presentation to our triage.   Labs, including quant and CBC, GC/CT are pending to r/o uterine infection or PID.

## 2014-09-13 IMAGING — CR DG RIBS 2V*R*
1 series · 3 of 3 positions shown · non-contrast
Comparison: 08/14/2011

CLINICAL DATA: Injury, right rib pain

EXAM:
RIGHT RIBS - 2 VIEW

[Series 1: w chest pa · 0.14mm/px · 3 of 3 slices shown]
[im 1/3]
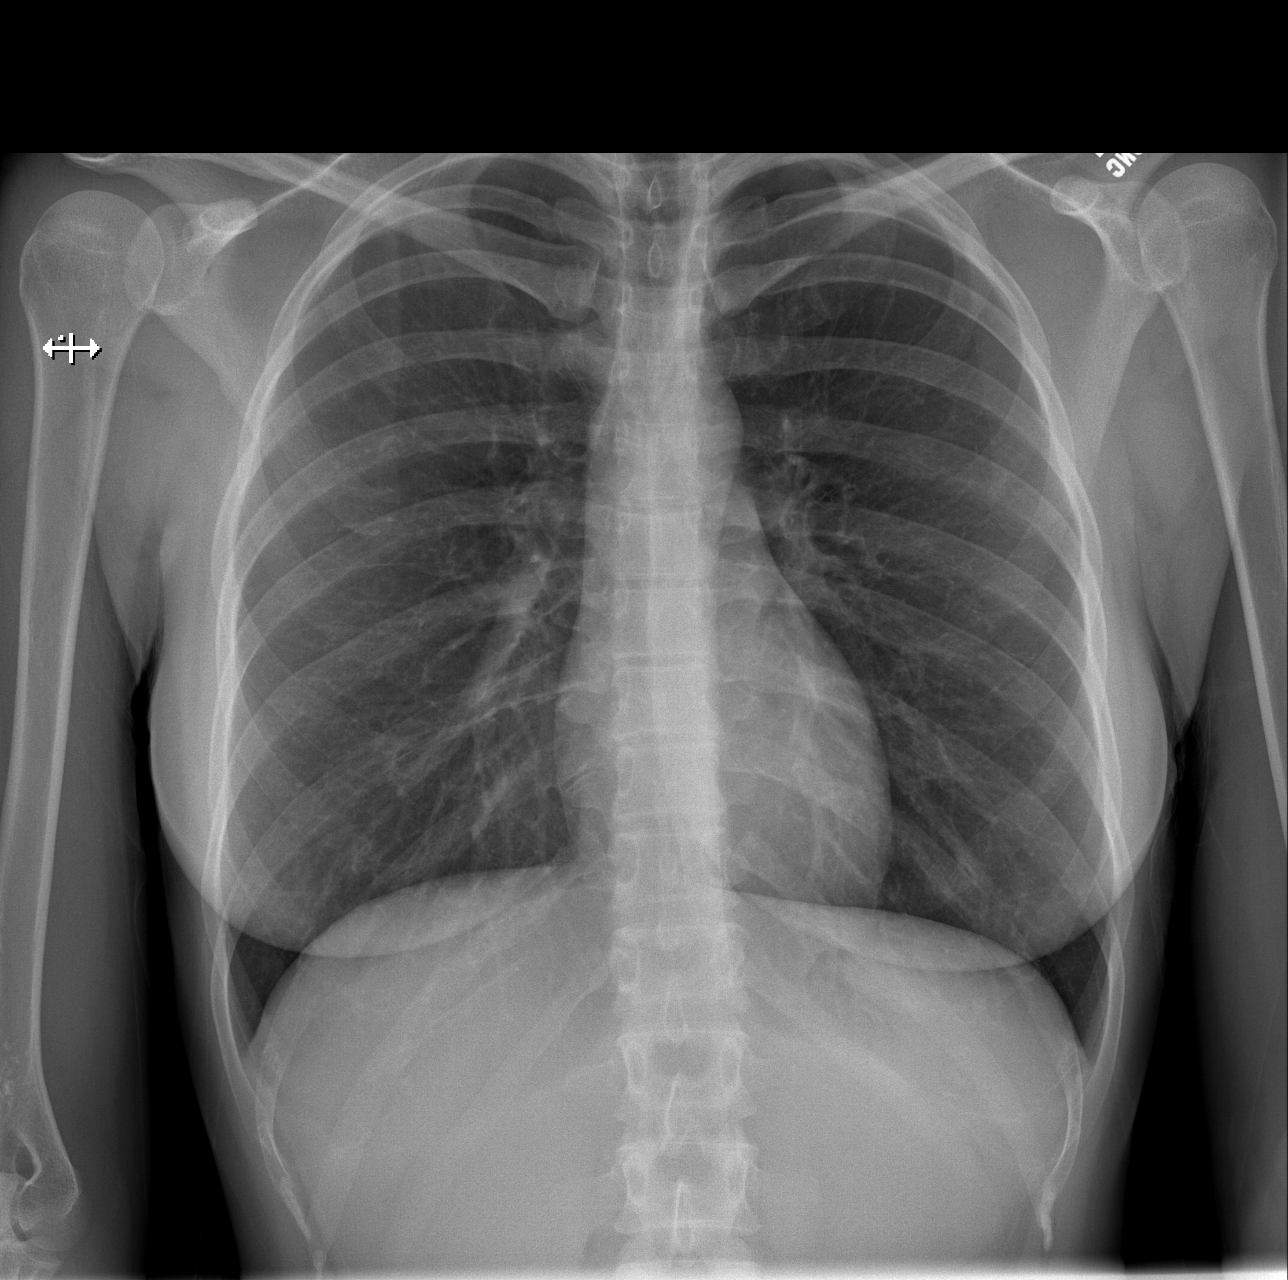
[im 2/3]
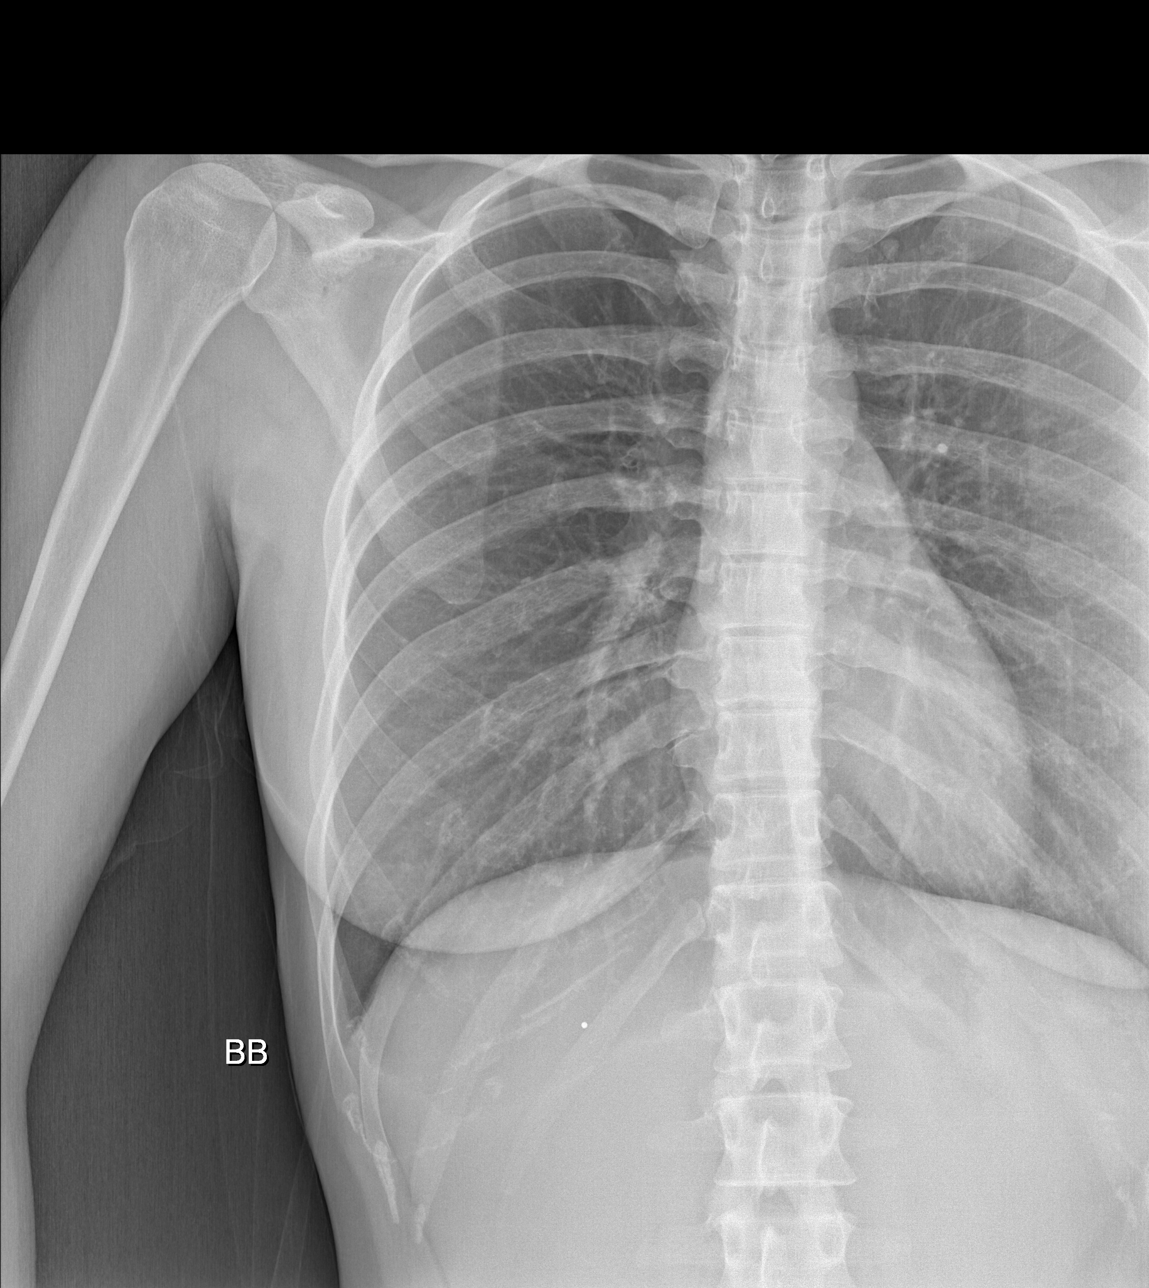
[im 3/3]
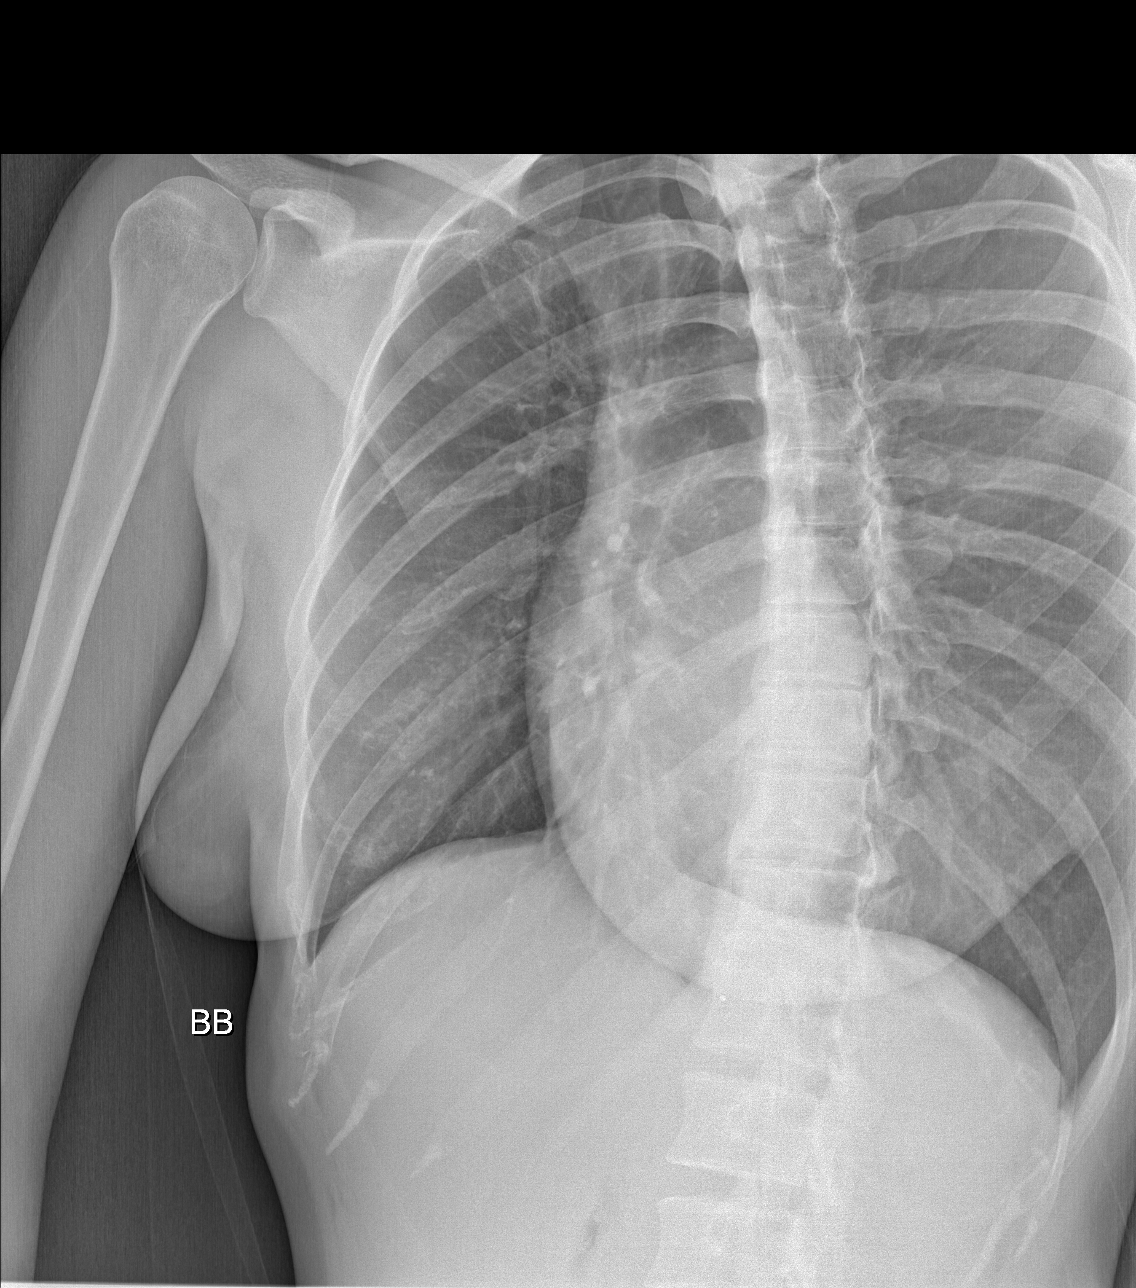

[3 of 3 positions shown; findings below may reference images not displayed]

FINDINGS: No fracture or other bone lesions are seen involving the ribs. There
is no evidence of pneumothorax or pleural effusion. Both lungs are
clear. Heart size and mediastinal contours are within normal limits.
IMPRESSION: Negative.

## 2014-12-10 ENCOUNTER — Emergency Department (HOSPITAL_COMMUNITY)
Admission: EM | Admit: 2014-12-10 | Discharge: 2014-12-10 | Disposition: A | Discharge status: Home / Self Care | Payer: Medicaid Other | Attending: Emergency Medicine | Admitting: Emergency Medicine

## 2014-12-10 ENCOUNTER — Emergency Department (HOSPITAL_COMMUNITY): Payer: Medicaid Other

## 2014-12-10 DIAGNOSIS — M545 Low back pain, unspecified: Secondary | ICD-10-CM

## 2014-12-10 DIAGNOSIS — Z8659 Personal history of other mental and behavioral disorders: Secondary | ICD-10-CM | POA: Insufficient documentation

## 2014-12-10 DIAGNOSIS — Z9851 Tubal ligation status: Secondary | ICD-10-CM | POA: Diagnosis not present

## 2014-12-10 DIAGNOSIS — Z79899 Other long term (current) drug therapy: Secondary | ICD-10-CM | POA: Diagnosis not present

## 2014-12-10 DIAGNOSIS — J45909 Unspecified asthma, uncomplicated: Secondary | ICD-10-CM | POA: Insufficient documentation

## 2014-12-10 DIAGNOSIS — R109 Unspecified abdominal pain: Secondary | ICD-10-CM

## 2014-12-10 DIAGNOSIS — F17210 Nicotine dependence, cigarettes, uncomplicated: Secondary | ICD-10-CM | POA: Insufficient documentation

## 2014-12-10 DIAGNOSIS — Z3202 Encounter for pregnancy test, result negative: Secondary | ICD-10-CM | POA: Insufficient documentation

## 2014-12-10 DIAGNOSIS — Z880 Allergy status to penicillin: Secondary | ICD-10-CM | POA: Diagnosis not present

## 2014-12-10 DIAGNOSIS — Z91040 Latex allergy status: Secondary | ICD-10-CM | POA: Diagnosis not present

## 2014-12-10 LAB — URINALYSIS, ROUTINE W REFLEX MICROSCOPIC
BILIRUBIN URINE: NEGATIVE
Glucose, UA: NEGATIVE mg/dL
Ketones, ur: NEGATIVE mg/dL
Leukocytes, UA: NEGATIVE
NITRITE: NEGATIVE
PROTEIN: NEGATIVE mg/dL
pH: 6 (ref 5.0–8.0)

## 2014-12-10 LAB — URINE MICROSCOPIC-ADD ON

## 2014-12-10 LAB — PREGNANCY, URINE: PREG TEST UR: NEGATIVE

## 2014-12-10 MED ORDER — HYDROCODONE-ACETAMINOPHEN 5-325 MG PO TABS: ORAL_TABLET | ORAL | Status: DC

## 2014-12-10 MED ORDER — HYDROCODONE-ACETAMINOPHEN 5-325 MG PO TABS
1.0000 | ORAL_TABLET | Freq: Once | ORAL | Status: AC
  Administered 2014-12-10: 1 via ORAL
  Filled 2014-12-10: qty 1

## 2014-12-10 MED ORDER — METHOCARBAMOL 500 MG PO TABS: 1000.0000 mg | ORAL_TABLET | Freq: Four times a day (QID) | ORAL | Status: DC | PRN

## 2014-12-10 MED ORDER — NAPROXEN 250 MG PO TABS: 250.0000 mg | ORAL_TABLET | Freq: Two times a day (BID) | ORAL | Status: DC | PRN

## 2014-12-10 NOTE — ED Notes (Signed)
Pt verbalized understanding of no driving and to use caution within 4 hours of taking pain meds due to meds cause drowsiness 

## 2014-12-10 NOTE — Discharge Instructions (Signed)
°Emergency Department Resource Guide °1) Find a Doctor and Pay Out of Pocket °Although you won't have to find out who is covered by your insurance plan, it is a good idea to ask around and get recommendations. You will then need to call the office and see if the doctor you have chosen will accept you as a new patient and what types of options they offer for patients who are self-pay. Some doctors offer discounts or will set up payment plans for their patients who do not have insurance, but you will need to ask so you aren't surprised when you get to your appointment. ° °2) Contact Your Local Health Department °Not all health departments have doctors that can see patients for sick visits, but many do, so it is worth a call to see if yours does. If you don't know where your local health department is, you can check in your phone book. The CDC also has a tool to help you locate your state's health department, and many state websites also have listings of all of their local health departments. ° °3) Find a Walk-in Clinic °If your illness is not likely to be very severe or complicated, you may want to try a walk in clinic. These are popping up all over the country in pharmacies, drugstores, and shopping centers. They're usually staffed by nurse practitioners or physician assistants that have been trained to treat common illnesses and complaints. They're usually fairly quick and inexpensive. However, if you have serious medical issues or chronic medical problems, these are probably not your best option. ° °No Primary Care Doctor: °- Call [facility] at  832-8000 - they can help you locate a primary care doctor that  accepts your insurance, provides certain services, etc. °- Physician Referral Service- 1-800-533-3463 ° °Chronic Pain Problems: °Organization         Address  Phone   Notes  °[facility]  (336) 297-2271 Patients need to be referred by their primary care doctor.  ° °Medication  Assistance: °Organization         Address  Phone   Notes  °[place] [facility] [address] °[place], [place] [address] (336) 641-8030 --Must be a resident of [place] °-- Must have NO insurance coverage whatsoever (no Medicaid/ Medicare, etc.) °-- The pt. MUST have a primary care doctor that directs their care regularly and follows them in the community °  °[facility]  (866) 331-1348   °[employer]  (888) 892-1162   ° °Agencies that provide inexpensive medical care: °Organization         Address  Phone   Notes  °[facility]  (336) 832-8035   °[facility]    (336) 832-7272   °[facility] [address] °[place], [place] [address] (336) 832-4777   °[facility] [address], °[place] (336) 271-4999   °[facility]    (336) 373-0678   °[facility]    (336) 272-1050   °[facility] ° [address], [place] Phone:  (336) 832-4444, Fax:  (336) 832-4440 Hours of Operation:  9 am - 6 pm, M-F.  Also accepts Medicaid/Medicare and self-pay.  °[facility] ° [address],  Phone: (336) 832-3150, Fax: (336) 832-3151. Hours of Operation:  8:30 am - 5:30 pm, M-F.  Also accepts Medicaid and self-pay.  °[facility] [place] [address], [place] Phone: (336) 878-6027   °[facility] [address], [place], [place] (336)723-1848, Ext. 123 Mondays & Thursdays: 7-9 AM.  First 15 patients are seen on a first come, first serve basis. °  ° °Medicaid-accepting [place] Providers: ° °Organization         Address  Phone   Notes  °[facility] [address], [place] (336) 641-2100 Also accepts self-pay patients.  °[facility] [address], [place] ° (336) 856-9996   °[facility] [address], [place]  (336) 288-8857   °[facility] [address] [place] [address], [place] (336) 299-7000   °Veita Bland [address], [place]  ° (336) 373-1557 Only accepts [place] Access Medicaid patients after they have their name applied to their card.  ° °Self-Pay (no insurance) in [place]: ° °Organization         Address  Phone   Notes  °Sickle Cell Patients, [facility] [address], [place] (336) 832-1970   °[facility] [address], [place] (336) 832-4400   °[facility] ° [address], [place] (336) 992-4800   °[facility]/Dr. Osei-Bonsu ° [address] [place] [address], [place] or [address], [place] (336) 841-8500 Phone number for both [place] and [place] locations is the same.  °[facility] [address], [place] (336) 299-0000   °[facility] [address] [place] [address], [place] or [address] (336) 852-7530 °(336) 878-2260   °[facility] [address], [place] (336) 350-1642, phone; (336) 294-5005, fax Sees patients 1st and 3rd Saturday of every month.  Must not qualify for public or private insurance (i.e. Medicaid, Medicare, [place] Health Choice, Veterans' Benefits) • Household income should be no more than 200% of the poverty level •The clinic cannot treat you if you are pregnant or think you are pregnant • Sexually transmitted diseases are not treated at the clinic.  ° ° °Dental Care: °Organization         Address  Phone  Notes  °[place] [facility] [address], [place] (336) 641-6152 Accepts children up to age 21 who are enrolled in Medicaid or [place] Health Choice; pregnant women with a Medicaid card; and children who have applied for Medicaid or [place] Health Choice, but were declined, whose parents can pay a reduced fee at time of service.  °[place]  [facility] [place]  [address], [place] (336) 641-7733 Accepts children up to age 21 who are enrolled in Medicaid or [place] Health Choice; pregnant women with a Medicaid card; and children who have applied for Medicaid or [place] Health Choice, but were declined, whose parents can pay a reduced fee at time of service.  °[facility] ° [address], [place] (336) 641-4533 Patients are seen by appointment only. Walk-ins are not accepted. [facility] will see patients 18 years of age and older. °Monday - Tuesday (8am-5pm) °Most Wednesdays (8:30-5pm) °$30 per visit, cash only  °[facility] ° [address], [place] (336) 641-4533 Patients are seen by appointment only. Walk-ins are not accepted. [facility] will see patients 18 years of age and older. °One   Wednesday Evening (Monthly: Volunteer Based).  $30 per visit, cash only  °[facility]  (919) 537-3737 for adults; Children under age 4, call Graduate Pediatric Dentistry at (919) [phone]. Children aged 4-14, please call (919) [phone] to request a pediatric application. ° Dental services are provided in all areas of dental care including fillings, crowns and bridges, complete and partial dentures, implants, gum treatment, root canals, and extractions. Preventive care is also provided. Treatment is provided to both adults and children. °Patients are selected via a lottery and there is often a waiting list. °  °[facility] [address], °[place] ° (336) 763-8833 [email or web address] °  °[facility] [address], [place], [place] (336)723-1848, Ext. 123 Second and Fourth Thursday of each month, opens at 6:30 AM; Clinic ends at 9 AM.  Patients are seen on a first-come first-served basis, and a limited number are seen during each clinic.  ° °[facility] ° [address], [place], [place] (336) 723-7904    Eligibility Requirements °You must have lived in [place], [place], or [place] counties for at least the last three months. °  You cannot be eligible for state or federal sponsored healthcare insurance, including Veterans Administration, Medicaid, or Medicare. °  You generally cannot be eligible for healthcare insurance through your employer.  °  How to apply: °Eligibility screenings are held every Tuesday and Wednesday afternoon from 1:00 pm until 4:00 pm. You do not need an appointment for the interview!  °[facility] [address], [place], [place] 336-631-2330   °[place] [facility]  336-342-8273   °[place]  336-703-3100   °[facility]  336-570-6415   ° °Behavioral Health Resources in the Community: °Intensive Outpatient Programs °Organization         Address  Phone  Notes  °[facility] [address], [place], [place] 336-878-6098   °[facility] [address], [place], [place] 336-832-9800   °[facility] [address], [place], [place] ° 336-882-2125   °[facility] [address],  °[place], [place] 1-800-853-5163 or 336-641-4981   °Substance Abuse Resources °Organization         Address  Phone  Notes  °[facility]  336-882-2125   °[facility]  336-784-9470   °[facility]  336-285-9073   °[facility]  336-845-3988   °Residential & Outpatient Substance Abuse Program  1-800-659-3381   °Psychological Services °Organization         Address  Phone  Notes  °[facility]  336- 832-9600   °[facility]  336- 378-7881   °[facility] [address], [place] 1-800-853-5163 or 336-641-4981   ° °Mobile Crisis Teams °Organization         Address  Phone  Notes  °[facility], Mobile Crisis Care Unit  1-877-626-1772   °Assertive °[facility] ° [address]  [place], [place] 336-834-9664   °Sharon DeEsch [address] °[place] 336-554-5454   ° °Self-Help/Support Groups °Organization         Address  Phone             Notes  °[facility]  - variety of support groups  336- 373-1402 Call for more information  °Narcotics Anonymous (NA), [facility] [address], °[place]   2 meetings at this location  ° °  Residential Treatment Programs °Organization         Address  Phone  Notes  °[facility] [address],    °[place]  1-866-801-8205   °[facility] ° [address], [place], [place] 704-293-8524   °[facility] [address], [place] 336-845-3988 Admissions: 8am-3pm M-F  °[facility] [address]    °[place], [place] 336-841-1104   °[facility] [address], [place], [place] 336-379-7146   °[facility] [address]  °[place], [place] 336-285-9073   °[facility] - Intensive Outpatient [address], [place], [place] 336-852-3033   °[facility] [address]  °[place], [place] 1-877-615-2722 or 336-784-9470   °[facility] [address] [place], [place] 336-227-7417 Accepts Medicaid  °[facility] [address]  °[place] 1-800-659-3381 Substance Abuse/Addiction Treatment  ° °[place] Behavioral Health Resources °Organization         Address  Phone  Notes  °[facility]  (888) 581-9988   °Julie Brannon, PhD [address] [place], [place]   (336) 349-5553 or (336) 951-0000   °[facility]   [address] °[place], [place] (336) 349-4454   °[facility] [address], [place], [place] (336) 342-8316 Insurance/Medicaid/sponsorship through [facility]  °[facility] [address]                                    [place], [place] (336) 342-8316 Therapy/tele-psych/case    °[facility] [address]  ° [place], [place] (336) 349-2233    °Dr. Arfeen  (336) 349-4544   °[facility] [place]  [employer] [place] [facility] 1) [address], [place] °2) [address], [place] °3)  [address], [place] (336) 349-3220 °(336) 342-7768 ° °(336) 342-8140   °[place] [facility] (336) 342-1394 or (336) 342-3537 (After Hours)    ° ° °Take the prescriptions as directed.  Apply moist heat or ice to the area(s) of discomfort, for 15 minutes at a time, several times per day for the next few days.  Do not fall asleep on a heating or ice pack.  Call your regular medical doctor on Monday to schedule a follow up appointment next week.  Return to the Emergency Department immediately if worsening. ° °

## 2014-12-10 NOTE — ED Provider Notes (Signed)
CSN: [id number]     Arrival date & time 12/10/14  1846 History   First MD Initiated Contact with Patient 12/10/14 1857     Chief Complaint  Patient presents with  . Flank Pain     HPI Pt was seen at 1910. Per pt, c/o gradual onset and persistence of constant left sided low back "pain" for the past 2 days. Describes the pain as "sharp" and "it hurt so much I threw up once yesterday." Pain worsens with palpation of the area and body position changes. Denies incont/retention of bowel or bladder, no saddle anesthesia, no focal motor weakness, no tingling/numbness in extremities, no fevers, no injury, no abd pain.      Past Medical History  Diagnosis Date  . Asthma   . Seizures   . Anxiety   . Bipolar 1 disorder    Past Surgical History  Procedure Laterality Date  . Tubal ligation  11/2010    Social History  Substance Use Topics  . Smoking status: Current Every Day Smoker -- 0.50 packs/day    Types: Cigarettes  . Smokeless tobacco: Not on file  . Alcohol Use: No     Comment: occasional    Review of Systems ROS: Statement: All systems negative except as marked or noted in the HPI; Constitutional: Negative for fever and chills. ; ; Eyes: Negative for eye pain, redness and discharge. ; ; ENMT: Negative for ear pain, hoarseness, nasal congestion, sinus pressure and sore throat. ; ; Cardiovascular: Negative for chest pain, palpitations, diaphoresis, dyspnea and peripheral edema. ; ; Respiratory: Negative for cough, wheezing and stridor. ; ; Gastrointestinal: +N/V x1. Negative for diarrhea, abdominal pain, blood in stool, hematemesis, jaundice and rectal bleeding. . ; ; Genitourinary: Negative for dysuria and hematuria. ; ; GYN:  No vaginal bleeding, no vaginal discharge, no vulvar pain. ;; Musculoskeletal: +LBP. Negative for neck pain. Negative for swelling and trauma.; ; Skin: Negative for pruritus, rash, abrasions, blisters, bruising and skin lesion.; ; Neuro: Negative for headache,  lightheadedness and neck stiffness. Negative for weakness, altered level of consciousness , altered mental status, extremity weakness, paresthesias, involuntary movement, seizure and syncope.      Allergies  Eggs or egg-derived products; Penicillins; Iodine; and Latex  Home Medications   Prior to Admission medications   Medication Sig Start Date End Date Taking? Authorizing Provider  acetaminophen (TYLENOL) 500 MG tablet Take 1,000 mg by mouth every 6 (six) hours as needed for pain.   Yes Historical Provider, MD  Aspirin-Salicylamide-Caffeine (BC HEADACHE POWDER PO) Take 1 Package by mouth daily as needed (pain).   Yes Historical Provider, MD  Cyanocobalamin (VITAMIN B-12 PO) Take 1 tablet by mouth daily.   Yes Historical Provider, MD   BP 143/88 mmHg  Pulse 83  Temp(Src) 98.5 F (36.9 C) (Oral)  Resp 16  Ht 5\' 4"  (1.626 m)  Wt 115 lb (52.164 kg)  BMI 19.73 kg/m2  SpO2 100%  LMP 12/06/2014 Physical Exam 1915: Physical examination:  Nursing notes reviewed; Vital signs and O2 SAT reviewed;  Constitutional: Well developed, Well nourished, Well hydrated, In no acute distress; Head:  Normocephalic, atraumatic; Eyes: EOMI, PERRL, No scleral icterus; ENMT: Mouth and pharynx normal, Mucous membranes moist; Neck: Supple, Full range of motion, No lymphadenopathy; Cardiovascular: Regular rate and rhythm, No murmur, rub, or gallop; Respiratory: Breath sounds clear & equal bilaterally, No rales, rhonchi, wheezes.  Speaking full sentences with ease, Normal respiratory effort/excursion; Chest: Nontender, Movement normal; Abdomen: Soft, Nontender, Nondistended, Normal bowel  sounds; Genitourinary: No CVA tenderness; Spine:  No midline CS, TS, LS tenderness. +TTP left lumbar paraspinal muscles. No rash.;; Extremities: Pulses normal, No tenderness, No edema, No calf edema or asymmetry.; Neuro: AA&Ox3, Major CN grossly intact.  Speech clear. No gross focal motor or sensory deficits in extremities. Climbs on  and off stretcher easily by herself. Gait steady.; Skin: Color normal, Warm, Dry.   ED Course  Procedures (including critical care time) Labs Review   Imaging Review  I have personally reviewed and evaluated these images and lab results as part of my medical decision-making.   EKG Interpretation None      MDM  MDM Reviewed: previous chart, nursing note and vitals Reviewed previous: labs Interpretation: labs and CT scan     Results for orders placed or performed during the hospital encounter of 12/10/14  Pregnancy, urine  Result Value Ref Range   Preg Test, Ur NEGATIVE NEGATIVE  Urinalysis, Routine w reflex microscopic  Result Value Ref Range   Color, Urine YELLOW YELLOW   APPearance CLEAR CLEAR   Specific Gravity, Urine >1.030 (H) 1.005 - 1.030   pH 6.0 5.0 - 8.0   Glucose, UA NEGATIVE NEGATIVE mg/dL   Hgb urine dipstick TRACE (A) NEGATIVE   Bilirubin Urine NEGATIVE NEGATIVE   Ketones, ur NEGATIVE NEGATIVE mg/dL   Protein, ur NEGATIVE NEGATIVE mg/dL   Nitrite NEGATIVE NEGATIVE   Leukocytes, UA NEGATIVE NEGATIVE  Urine microscopic-add on  Result Value Ref Range   Squamous Epithelial / LPF 0-5 (A) NONE SEEN   WBC, UA 0-5 0 - 5 WBC/hpf   RBC / HPF 0-5 0 - 5 RBC/hpf   Bacteria, UA FEW (A) NONE SEEN   Ct Renal Stone Study 12/10/2014  CLINICAL DATA:  29 year old female with left flank pain. EXAM: CT ABDOMEN AND PELVIS WITHOUT CONTRAST TECHNIQUE: Multidetector CT imaging of the abdomen and pelvis was performed following the standard protocol without IV contrast. COMPARISON:  None. FINDINGS: Evaluation of this exam is limited in the absence of intravenous contrast. The visualized lung bases are clear. No intra-abdominal free air or free fluid there The liver, gallbladder, pancreas, spleen, adrenal glands, kidneys, visualized ureters, and urinary bladder appear unremarkable. The uterus is retroverted and appears grossly unremarkable. The visualized ovaries are grossly  unremarkable. There is moderate stool throughout the colon. No evidence of bowel obstruction or inflammation. The appendix may contain small appendicoliths otherwise unremarkable. The abdominal aorta and IVC appear unremarkable on this noncontrast study. No portal venous gas identified. There is no adenopathy. There is diastases of anterior abdominal wall musculature in the midline with a small fat containing umbilical hernia. The abdominal wall soft tissues appear unremarkable. The osseous structures are intact. IMPRESSION: No acute intra-abdominal or pelvic pathology. No hydronephrosis or nephrolithiasis. Electronically Signed   By: Elgie Collard M.D.   On: 12/10/2014 20:28    2045:  Workup reassuring. Tx symptomatically at this time. Dx and testing d/w pt and family.  Questions answered.  Verb understanding, agreeable to d/c home with outpt f/u.   Samuel Jester, DO 12/13/14 2227

## 2014-12-10 NOTE — ED Notes (Signed)
Left flank pain that radiates to mid lower back since yesterday, emesis x 1 today after a real sharp pain, denies burning on urination or vaginal discharge

## 2015-03-23 ENCOUNTER — Encounter (HOSPITAL_COMMUNITY): Payer: Self-pay

## 2015-03-23 ENCOUNTER — Emergency Department (HOSPITAL_COMMUNITY)
Admission: EM | Admit: 2015-03-23 | Discharge: 2015-03-23 | Disposition: A | Discharge status: Home / Self Care | Payer: Medicaid Other | Attending: Emergency Medicine | Admitting: Emergency Medicine

## 2015-03-23 DIAGNOSIS — Z791 Long term (current) use of non-steroidal anti-inflammatories (NSAID): Secondary | ICD-10-CM | POA: Insufficient documentation

## 2015-03-23 DIAGNOSIS — Z79899 Other long term (current) drug therapy: Secondary | ICD-10-CM | POA: Insufficient documentation

## 2015-03-23 DIAGNOSIS — L02212 Cutaneous abscess of back [any part, except buttock]: Secondary | ICD-10-CM | POA: Diagnosis present

## 2015-03-23 DIAGNOSIS — F319 Bipolar disorder, unspecified: Secondary | ICD-10-CM | POA: Diagnosis not present

## 2015-03-23 DIAGNOSIS — J45909 Unspecified asthma, uncomplicated: Secondary | ICD-10-CM | POA: Diagnosis not present

## 2015-03-23 DIAGNOSIS — F17210 Nicotine dependence, cigarettes, uncomplicated: Secondary | ICD-10-CM | POA: Diagnosis not present

## 2015-03-23 DIAGNOSIS — R569 Unspecified convulsions: Secondary | ICD-10-CM | POA: Diagnosis not present

## 2015-03-23 MED ORDER — ACETAMINOPHEN-CODEINE #3 300-30 MG PO TABS: 1.0000 | ORAL_TABLET | Freq: Four times a day (QID) | ORAL | Status: DC | PRN

## 2015-03-23 MED ORDER — RANITIDINE HCL 150 MG PO TABS: 150.0000 mg | ORAL_TABLET | Freq: Two times a day (BID) | ORAL | Status: DC

## 2015-03-23 MED ORDER — DOXYCYCLINE HYCLATE 100 MG PO CAPS: 100.0000 mg | ORAL_CAPSULE | Freq: Two times a day (BID) | ORAL | Status: DC

## 2015-03-23 NOTE — Discharge Instructions (Signed)
Abscess °An abscess (boil or furuncle) is an infected area on or under the skin. This area is filled with yellowish-white fluid (pus) and other material (debris). °HOME CARE  °· Only take medicines as told by your doctor. °· If you were given antibiotic medicine, take it as directed. Finish the medicine even if you start to feel better. °· If gauze is used, follow your doctor's directions for changing the gauze. °· To avoid spreading the infection: °¨ Keep your abscess covered with a bandage. °¨ Wash your hands well. °¨ Do not share personal care items, towels, or whirlpools with others. °¨ Avoid skin contact with others. °· Keep your skin and clothes clean around the abscess. °· Keep all doctor visits as told. °GET HELP RIGHT AWAY IF:  °· You have more pain, puffiness (swelling), or redness in the wound site. °· You have more fluid or blood coming from the wound site. °· You have muscle aches, chills, or you feel sick. °· You have a fever. °MAKE SURE YOU:  °· Understand these instructions. °· Will watch your condition. °· Will get help right away if you are not doing well or get worse. °  °This information is not intended to replace advice given to you by your health care provider. Make sure you discuss any questions you have with your health care provider. °  °Document Released: 06/13/2007 Document Revised: 06/26/2011 Document Reviewed: 03/10/2011 °Elsevier Interactive Patient Education ©2016 [employer] ° °

## 2015-03-23 NOTE — ED Notes (Signed)
Patient given discharge instruction, verbalized understand. Patient ambulatory out of the department.  

## 2015-03-23 NOTE — ED Provider Notes (Signed)
CSN: [id number]     Arrival date & time 03/23/15  7846 History   First MD Initiated Contact with Patient 03/23/15 1105     Chief Complaint  Patient presents with  . Abscess     (Consider location/radiation/quality/duration/timing/severity/associated sxs/prior Treatment) Patient is a 30 y.o. female presenting with abscess. The history is provided by the patient.  Abscess Location:  Torso Torso abscess location:  Upper back Abscess quality: draining, painful and redness   Red streaking: no   Duration:  2 weeks Progression:  Partially resolved Pain details:    Severity:  Mild   Duration:  2 weeks   Timing:  Intermittent Chronicity:  New Context: not diabetes and not immunosuppression   Relieved by:  Nothing Ineffective treatments:  Warm water soaks Associated symptoms: no fever, no headaches and no nausea   Risk factors: prior abscess     Past Medical History  Diagnosis Date  . Asthma   . Seizures (HCC)   . Anxiety   . Bipolar 1 disorder [facility])    Past Surgical History  Procedure Laterality Date  . Tubal ligation  11/2010   No family history on file. Social History  Substance Use Topics  . Smoking status: Current Every Day Smoker -- 0.50 packs/day    Types: Cigarettes  . Smokeless tobacco: None  . Alcohol Use: No     Comment: occasional   OB History    No data available     Review of Systems  Constitutional: Negative for fever.  Gastrointestinal: Negative for nausea.  Skin: Positive for wound.  Neurological: Positive for seizures. Negative for headaches.  Psychiatric/Behavioral: The patient is nervous/anxious.   All other systems reviewed and are negative.     Allergies  Eggs or egg-derived products; Penicillins; Iodine; and Latex  Home Medications   Prior to Admission medications   Medication Sig Start Date End Date Taking? Authorizing Provider  acetaminophen (TYLENOL) 500 MG tablet Take 1,000 mg by mouth every 6 (six) hours as needed for pain.     Historical Provider, MD  Aspirin-Salicylamide-Caffeine (BC HEADACHE POWDER PO) Take 1 Package by mouth daily as needed (pain).    Historical Provider, MD  Cyanocobalamin (VITAMIN B-12 PO) Take 1 tablet by mouth daily.    Historical Provider, MD  HYDROcodone-acetaminophen (NORCO/VICODIN) 5-325 MG tablet 1 or 2 tabs PO q6 hours prn pain 12/10/14   Samuel Jester, DO  methocarbamol (ROBAXIN) 500 MG tablet Take 2 tablets (1,000 mg total) by mouth 4 (four) times daily as needed for muscle spasms (muscle spasm/pain). 12/10/14   Samuel Jester, DO  naproxen (NAPROSYN) 250 MG tablet Take 1 tablet (250 mg total) by mouth 2 (two) times daily as needed for mild pain or moderate pain (take with food). 12/10/14   Samuel Jester, DO   BP 129/96 mmHg  Pulse 94  Temp(Src) 98.3 F (36.8 C) (Oral)  Resp 18  Ht  (1.626 m)  Wt 54.432 kg  BMI 20.59 kg/m2  SpO2 100%  LMP 03/09/2015 Physical Exam  Constitutional: She is oriented to person, place, and time. She appears well-developed and well-nourished.  Non-toxic appearance.  HENT:  Head: Normocephalic.  Right Ear: Tympanic membrane and external ear normal.  Left Ear: Tympanic membrane and external ear normal.  Eyes: EOM and lids are normal. Pupils are equal, round, and reactive to light.  Neck: Normal range of motion. Neck supple. Carotid bruit is not present.  Cardiovascular: Normal rate, regular rhythm, normal heart sounds, intact distal pulses and  normal pulses.   Pulmonary/Chest: Breath sounds normal. No respiratory distress.  Abdominal: Soft. Bowel sounds are normal. There is no tenderness. There is no guarding.  Musculoskeletal: Normal range of motion.  Lymphadenopathy:       Head (right side): No submandibular adenopathy present.       Head (left side): No submandibular adenopathy present.    She has no cervical adenopathy.  Neurological: She is alert and oriented to person, place, and time. She has normal strength. No cranial nerve deficit  or sensory deficit.  Skin: Skin is warm and dry.     Psychiatric: She has a normal mood and affect. Her speech is normal.  Nursing note and vitals reviewed.   ED Course  Procedures (including critical care time) Labs Review Labs Reviewed - No data to display  Imaging Review No results found. I have personally reviewed and evaluated these images and lab results as part of my medical decision-making.   EKG Interpretation None      MDM Vital signs reviewed. Pt in no distress. No evidence of advancing infection.  Pt c/o being agitated about having to wait so long. She just want antibiotics and pain med. She will continue tub soaks. Rx for doxycycline, tylenol codeine, and zantac given to the patient. Pt to see Dr Lovell SheehanJenkins for evaluation if not improving.   Final diagnoses:  Abscess of back    **I have reviewed nursing notes, vital signs, and all appropriate lab and imaging results for this patient.Ivery Quale*    Oliviana Mcgahee, PA-C 03/24/15 1313  Bethann BerkshireJoseph Zammit, MD 03/24/15 [phone]

## 2015-03-23 NOTE — ED Notes (Signed)
PA at the bedside.

## 2015-03-23 NOTE — ED Notes (Signed)
Pt reports abscess to upper back x 2 1/2 weeks.
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
## 2016-06-12 ENCOUNTER — Ambulatory Visit
Admission: EM | Admit: 2016-06-12 | Discharge: 2016-06-12 | Disposition: A | Discharge status: Home / Self Care | Payer: Medicaid Other | Attending: Family Medicine | Admitting: Family Medicine

## 2016-06-12 DIAGNOSIS — S0501XA Injury of conjunctiva and corneal abrasion without foreign body, right eye, initial encounter: Secondary | ICD-10-CM

## 2016-06-12 MED ORDER — TETANUS-DIPHTH-ACELL PERTUSSIS 5-2.5-18.5 LF-MCG/0.5 IM SUSP
0.5000 mL | Freq: Once | INTRAMUSCULAR | Status: AC
  Administered 2016-06-12: 0.5 mL via INTRAMUSCULAR

## 2016-06-12 MED ORDER — CIPROFLOXACIN HCL 0.3 % OP SOLN: 2.0000 [drp] | Freq: Four times a day (QID) | OPHTHALMIC | 0 refills | Status: DC

## 2016-06-12 NOTE — Discharge Instructions (Signed)
Use medication as prescribed. Rest. Keep contact out until resolved and after follow up appointment. Avoid rubbing.   Follow up with opthalmology in 2 days. Call today to schedule as discussed.   Follow up with your primary care physician this week as needed. Return to Urgent care for new or worsening concerns.

## 2016-06-12 NOTE — ED Triage Notes (Signed)
Pt states her contact was irritating her right eye last evening and she took out her contacts. Today awoke with sclera redness, eye pain, and watery eye on right. Pain 10/10. Also reports facial pain and ear pain on right side.

## 2016-06-12 NOTE — ED Provider Notes (Signed)
[facility] ____________________________________________  Time seen: Approximately 1:41 PM  I have reviewed the triage vital signs and the nursing notes.   HISTORY  Chief Complaint Eye Problem   HPI Isabel Robertson is a 31 y.o. female presenting with husband at bedside for evaluation of right eye redness, irritation and pain. Patient reports yesterday she had a some intermittent irritation sensation to her right thigh which prompted her to remove her right eye contact. Reports she sometimes has similar and not abnormal. Reports she removed her contact later last night, felt more irritation, went to bed. Patient states that when she woke up this morning she had redness that was present as well as increased discomfort. Patient reports this has continued prompting her to be evaluated. Any foreign body sensation, foreign bodies. Denies working with metals. Denies chemical exposure or contact. Denies trauma. Denies sick contacts. Patient states that her right eye is sensitive to light. Patient states that she is legally blind without her contacts. Patient states she's unable to access any vision change or loss due to not having glasses. Denies any known vision changes.  Patient states her eyes has had some watering, denies any discharge otherwise. Reports also some tenderness surrounding her eye on the right only. Denies any recent cough, congestion, sore throat, fever or headache. Denies any other complaints. otherwise feels well. Last tetanus immunization 10 years ago. Patient states as she does not currently have glasses as they broke. Patient has kept her contact out of her right eye since last night.  Denies chest pain, shortness of breath, abdominal pain, or rash. Denies recent sickness. Denies recent antibiotic use.   Patient's last menstrual period was 06/12/2016. Denies pregnancy.   Past Medical History:  Diagnosis Date  . Anxiety   . Asthma   . Bipolar 1 disorder (HCC)   .  Seizures [facility])     Patient Active Problem List   Diagnosis Date Noted  . Vaginal bleeding 08/17/2014    Past Surgical History:  Procedure Laterality Date  . TUBAL LIGATION  11/2010     No current facility-administered medications for this encounter.   Current Outpatient Prescriptions:  .  acetaminophen (TYLENOL) 500 MG tablet, Take 1,000 mg by mouth every 6 (six) hours as needed for pain., Disp: , Rfl:  .  acetaminophen-codeine (TYLENOL #3) 300-30 MG tablet, Take 1-2 tablets by mouth every 6 (six) hours as needed for moderate pain., Disp: 15 tablet, Rfl: 0 .  Aspirin-Salicylamide-Caffeine (BC HEADACHE POWDER PO), Take 1 Package by mouth daily as needed (pain)., Disp: , Rfl:  .  ciprofloxacin (CILOXAN) 0.3 % ophthalmic solution, Place 2 drops into the right eye 4 (four) times daily. for the next 5 days., Disp: 5 mL, Rfl: 0 .  Cyanocobalamin (VITAMIN B-12 PO), Take 1 tablet by mouth daily., Disp: , Rfl:  .  doxycycline (VIBRAMYCIN) 100 MG capsule, Take 1 capsule (100 mg total) by mouth 2 (two) times daily., Disp: 14 capsule, Rfl: 0 .  HYDROcodone-acetaminophen (NORCO/VICODIN) 5-325 MG tablet, 1 or 2 tabs PO q6 hours prn pain, Disp: 10 tablet, Rfl: 0 .  methocarbamol (ROBAXIN) 500 MG tablet, Take 2 tablets (1,000 mg total) by mouth 4 (four) times daily as needed for muscle spasms (muscle spasm/pain)., Disp: 25 tablet, Rfl: 0 .  naproxen (NAPROSYN) 250 MG tablet, Take 1 tablet (250 mg total) by mouth 2 (two) times daily as needed for mild pain or moderate pain (take with food)., Disp: 14 tablet, Rfl: 0 .  ranitidine (ZANTAC)  150 MG tablet, Take 1 tablet (150 mg total) by mouth 2 (two) times daily. For stomach irritation., Disp: 30 tablet, Rfl: 0  Allergies Eggs or egg-derived products; Penicillins; Iodine; and Latex  History reviewed. No pertinent family history.  Social History Social History  Substance Use Topics  . Smoking status: Current Every Day Smoker    Packs/day: 1.00     Types: Cigarettes  . Smokeless tobacco: Never Used  . Alcohol use No     Comment: occasional    Review of Systems Constitutional: No fever/chills Eyes: As above. ENT: No sore throat. Cardiovascular: Denies chest pain. Respiratory: Denies shortness of breath. Gastrointestinal: No abdominal pain.   Musculoskeletal: Negative for back pain. Skin: Negative for rash.   ____________________________________________   PHYSICAL EXAM:  VITAL SIGNS: ED Triage Vitals  Enc Vitals Group     BP 06/12/16 1243 107/78     Pulse Rate 06/12/16 1243 85     Resp 06/12/16 1243 16     Temp 06/12/16 1243 98.2 F (36.8 C)     Temp Source 06/12/16 1243 Oral     SpO2 06/12/16 1243 99 %     Weight 06/12/16 1245 136 lb (61.7 kg)     Height 06/12/16 1241 5\' 4"  (1.626 m)     Head Circumference --      Peak Flow --      Pain Score 06/12/16 1245 10     Pain Loc --      Pain Edu? --      Excl. in GC? --     Constitutional: Alert and oriented. Well appearing and in no acute distress. Eyes: Left conjunctiva normal. Right conductive a mild to moderately injected. No visible foreign bodies. No drainage or exudate. No surrounding tenderness, swelling or erythema. PERRL. EOMI. no pain with EOMs. ENT      Head: Normocephalic and atraumatic.      Ears: Nontender, no erythema, normal TMs bilaterally. No surrounding tenderness, swelling or erythema bilaterally.      Nose: No congestion/rhinnorhea.      Mouth/Throat: Mucous membranes are moist.Oropharynx non-erythematous. Neck: No stridor. Supple without meningismus.  Hematological/Lymphatic/Immunilogical: No cervical lymphadenopathy. Cardiovascular: Normal rate, regular rhythm. Grossly normal heart sounds.  Good peripheral circulation. Respiratory: Normal respiratory effort without tachypnea nor retractions. Breath sounds are clear and equal bilaterally. No wheezes, rales, rhonchi. Musculoskeletal:  Ambulatory with steady gait. Neurologic:  Normal speech and  language.Speech is normal. No gait instability.  Skin:  Skin is warm, dry  Psychiatric: Mood and affect are normal. Speech and behavior are normal. Patient exhibits appropriate insight and judgment   ___________________________________________   LABS (all labs ordered are listed, but only abnormal results are displayed)  Labs Reviewed - No data to display ____________________________________________   PROCEDURES Procedures   Eye exam Procedure explained and verbal consent obtained.  Anesthesia: tetracaine ophthalmic 2 drops Right eye examined with fluorescein strip.  No foreign bodies visualized. Small Corneal abrasion noted at right high approximately 10:00. No appearance of foreign body. Patient tolerated well.    INITIAL IMPRESSION / ASSESSMENT AND PLAN / ED COURSE  Pertinent labs & imaging results that were available during my care of the patient were reviewed by me and considered in my medical decision making (see chart for details).  Very well-appearing patient. No acute distress. No foreign body visualized. Denies trauma. No drainage. Corneal abrasion noted. Patient does wear contacts, discussed keeping contact out of right eye and total resolution and follow-up with ophthalmology. Will  treat patient with topical ciprofloxacin ophthalmic drops. Discussed follow-up with ophthalmology in 2 days. Tetanus and musician updated. Discussed cool compresses avoidance of rubbing and supportive care.Discussed indication, risks and benefits of medications with patient.  Discussed follow up with Primary care physician this week. Discussed follow up and return parameters including no resolution or any worsening concerns. Patient verbalized understanding and agreed to plan.   ____________________________________________   FINAL CLINICAL IMPRESSION(S) / ED DIAGNOSES  Final diagnoses:  Abrasion of right cornea, initial encounter     Discharge Medication List as of 06/12/2016  1:30 PM      START taking these medications   Details  ciprofloxacin (CILOXAN) 0.3 % ophthalmic solution Place 2 drops into the right eye 4 (four) times daily. for the next 5 days., Starting Tue 06/12/2016, Normal        Note: This dictation was prepared with Dragon dictation along with smaller phrase technology. Any transcriptional errors that result from this process are unintentional.         Renford Dills, NP 06/12/16 1349

## 2016-07-11 ENCOUNTER — Emergency Department
Admission: EM | Admit: 2016-07-11 | Discharge: 2016-07-11 | Disposition: A | Discharge status: Home / Self Care | Payer: Medicaid Other | Attending: Emergency Medicine | Admitting: Emergency Medicine

## 2016-07-11 DIAGNOSIS — F17210 Nicotine dependence, cigarettes, uncomplicated: Secondary | ICD-10-CM | POA: Insufficient documentation

## 2016-07-11 DIAGNOSIS — B349 Viral infection, unspecified: Secondary | ICD-10-CM

## 2016-07-11 DIAGNOSIS — N6452 Nipple discharge: Secondary | ICD-10-CM | POA: Insufficient documentation

## 2016-07-11 DIAGNOSIS — R51 Headache: Secondary | ICD-10-CM | POA: Insufficient documentation

## 2016-07-11 DIAGNOSIS — Y939 Activity, unspecified: Secondary | ICD-10-CM | POA: Insufficient documentation

## 2016-07-11 DIAGNOSIS — J45909 Unspecified asthma, uncomplicated: Secondary | ICD-10-CM | POA: Insufficient documentation

## 2016-07-11 DIAGNOSIS — Z79899 Other long term (current) drug therapy: Secondary | ICD-10-CM | POA: Insufficient documentation

## 2016-07-11 DIAGNOSIS — Y929 Unspecified place or not applicable: Secondary | ICD-10-CM | POA: Insufficient documentation

## 2016-07-11 DIAGNOSIS — R197 Diarrhea, unspecified: Secondary | ICD-10-CM | POA: Insufficient documentation

## 2016-07-11 DIAGNOSIS — R1110 Vomiting, unspecified: Secondary | ICD-10-CM | POA: Insufficient documentation

## 2016-07-11 DIAGNOSIS — S30860A Insect bite (nonvenomous) of lower back and pelvis, initial encounter: Secondary | ICD-10-CM | POA: Insufficient documentation

## 2016-07-11 DIAGNOSIS — R519 Headache, unspecified: Secondary | ICD-10-CM

## 2016-07-11 DIAGNOSIS — W57XXXA Bitten or stung by nonvenomous insect and other nonvenomous arthropods, initial encounter: Secondary | ICD-10-CM | POA: Insufficient documentation

## 2016-07-11 DIAGNOSIS — Y998 Other external cause status: Secondary | ICD-10-CM | POA: Insufficient documentation

## 2016-07-11 LAB — CBC WITH DIFFERENTIAL/PLATELET
Basophils Absolute: 0.1 10*3/uL (ref 0–0.1)
Basophils Relative: 1 %
EOS PCT: 6 %
Eosinophils Absolute: 0.7 10*3/uL (ref 0–0.7)
HCT: 41.9 % (ref 35.0–47.0)
HEMOGLOBIN: 14 g/dL (ref 12.0–16.0)
LYMPHS PCT: 16 %
Lymphs Abs: 2.1 10*3/uL (ref 1.0–3.6)
MCH: 30.7 pg (ref 26.0–34.0)
MCHC: 33.4 g/dL (ref 32.0–36.0)
MCV: 92.1 fL (ref 80.0–100.0)
MONOS PCT: 5 %
Monocytes Absolute: 0.6 10*3/uL (ref 0.2–0.9)
NEUTROS PCT: 72 %
Neutro Abs: 9.3 10*3/uL — ABNORMAL HIGH (ref 1.4–6.5)
Platelets: 278 10*3/uL (ref 150–440)
RBC: 4.55 MIL/uL (ref 3.80–5.20)
RDW: 13.5 % (ref 11.5–14.5)
WBC: 12.8 10*3/uL — AB (ref 3.6–11.0)

## 2016-07-11 LAB — URINALYSIS, COMPLETE (UACMP) WITH MICROSCOPIC
BILIRUBIN URINE: NEGATIVE
Bacteria, UA: NONE SEEN
GLUCOSE, UA: NEGATIVE mg/dL
HGB URINE DIPSTICK: NEGATIVE
Ketones, ur: NEGATIVE mg/dL
Leukocytes, UA: NEGATIVE
NITRITE: NEGATIVE
PH: 7 (ref 5.0–8.0)
Protein, ur: NEGATIVE mg/dL
RBC / HPF: NONE SEEN RBC/hpf (ref 0–5)
SPECIFIC GRAVITY, URINE: 1.018 (ref 1.005–1.030)
WBC, UA: NONE SEEN WBC/hpf (ref 0–5)

## 2016-07-11 LAB — PREGNANCY, URINE: PREG TEST UR: NEGATIVE

## 2016-07-11 LAB — COMPREHENSIVE METABOLIC PANEL
ALK PHOS: 39 U/L (ref 38–126)
ALT: 15 U/L (ref 14–54)
ANION GAP: 7 (ref 5–15)
AST: 22 U/L (ref 15–41)
Albumin: 3.9 g/dL (ref 3.5–5.0)
BUN: 10 mg/dL (ref 6–20)
CALCIUM: 9 mg/dL (ref 8.9–10.3)
CO2: 25 mmol/L (ref 22–32)
CREATININE: 0.64 mg/dL (ref 0.44–1.00)
Chloride: 105 mmol/L (ref 101–111)
Glucose, Bld: 89 mg/dL (ref 65–99)
Potassium: 3.6 mmol/L (ref 3.5–5.1)
Sodium: 137 mmol/L (ref 135–145)
TOTAL PROTEIN: 7 g/dL (ref 6.5–8.1)
Total Bilirubin: 0.7 mg/dL (ref 0.3–1.2)

## 2016-07-11 LAB — CK: Total CK: 98 U/L (ref 38–234)

## 2016-07-11 LAB — URINE DRUG SCREEN, QUALITATIVE (ARMC ONLY)
AMPHETAMINES, UR SCREEN: NOT DETECTED
Barbiturates, Ur Screen: NOT DETECTED
Benzodiazepine, Ur Scrn: NOT DETECTED
COCAINE METABOLITE, UR ~~LOC~~: NOT DETECTED
Cannabinoid 50 Ng, Ur ~~LOC~~: NOT DETECTED
MDMA (ECSTASY) UR SCREEN: NOT DETECTED
Methadone Scn, Ur: NOT DETECTED
Opiate, Ur Screen: NOT DETECTED
PHENCYCLIDINE (PCP) UR S: NOT DETECTED
Tricyclic, Ur Screen: NOT DETECTED

## 2016-07-11 LAB — TSH: TSH: 0.772 u[IU]/mL (ref 0.350–4.500)

## 2016-07-11 MED ORDER — METOCLOPRAMIDE HCL 5 MG/ML IJ SOLN
10.0000 mg | Freq: Once | INTRAMUSCULAR | Status: AC
  Administered 2016-07-11: 10 mg via INTRAVENOUS
  Filled 2016-07-11: qty 2

## 2016-07-11 MED ORDER — ONDANSETRON HCL 4 MG/2ML IJ SOLN
4.0000 mg | Freq: Once | INTRAMUSCULAR | Status: DC
  Filled 2016-07-11: qty 2

## 2016-07-11 MED ORDER — DOXYCYCLINE HYCLATE 100 MG PO CAPS: 100.0000 mg | ORAL_CAPSULE | Freq: Two times a day (BID) | ORAL | 0 refills | Status: AC

## 2016-07-11 MED ORDER — ONDANSETRON 4 MG PO TBDP: 4.0000 mg | ORAL_TABLET | Freq: Three times a day (TID) | ORAL | 0 refills | Status: DC | PRN

## 2016-07-11 MED ORDER — KETOROLAC TROMETHAMINE 30 MG/ML IJ SOLN
15.0000 mg | Freq: Once | INTRAMUSCULAR | Status: AC
  Administered 2016-07-11: 15 mg via INTRAVENOUS
  Filled 2016-07-11: qty 1

## 2016-07-11 MED ORDER — SODIUM CHLORIDE 0.9 % IV BOLUS (SEPSIS)
1000.0000 mL | Freq: Once | INTRAVENOUS | Status: AC
  Administered 2016-07-11: 1000 mL via INTRAVENOUS

## 2016-07-11 NOTE — ED Notes (Signed)
E-signature not working; pt gave verbal consent for discharge

## 2016-07-11 NOTE — ED Triage Notes (Signed)
Pt c/o generalized fatigue with N/V, bleeding gums, generalized swelling of hands and feet for the past week.

## 2016-07-11 NOTE — ED Provider Notes (Signed)
[facility] Emergency Department Provider Note  ____________________________________________  Time seen: Approximately 4:57 PM  I have reviewed the triage vital signs and the nursing notes.   HISTORY  Chief Complaint Fatigue and Emesis   HPI Isabel Robertson is a 31 y.o. female with a history of bipolar disorder, anxiety, and depression who presents for evaluation of multiple medical complaints. Patient reports 2 weeks of generalized fatigue, sleeping a lot, diffuse body aches. Also complaining of 3-4 days of watery diarrhea, several daily episodes. No melena or blood. Today she has had 2 episodes of NBNB. Has found two ticks on her back 3-4 days ago which she has removed. No dysuria, hematuria, s/p tubal ligation, no fever, no chills, no abdominal pain, no vaginal discharge. Patient also complaining of daily HAs for the last few days which she describes similar to her chronic migraine HAs, currently 7/10. Also complaining of intermittent episodes of gum bleeding. Also complaining of intermittent episodes of swelling of her hands and feet. She drinks two beers every other day. Patient has noticed daily episodes of milky discharge from her bilateral nipple while drying her breasts after a shower. Smokes one pack a day. Denies drug use. Denies depression, SI or HI.   Past Medical History:  Diagnosis Date  . Anxiety   . Asthma   . Bipolar 1 disorder (HCC)   . Seizures [facility](HCC)     Patient Active Problem List   Diagnosis Date Noted  . Vaginal bleeding 08/17/2014    Past Surgical History:  Procedure Laterality Date  . TUBAL LIGATION  11/2010    Prior to Admission medications   Medication Sig Start Date End Date Taking? Authorizing Provider  acetaminophen (TYLENOL) 500 MG tablet Take 1,000 mg by mouth every 6 (six) hours as needed for pain.    [provider]  acetaminophen-codeine (TYLENOL #3) 300-30 MG tablet Take 1-2 tablets by mouth every 6 (six) hours  as needed for moderate pain. 03/23/15   Ivery QualeBryant, [place], PA-C  Aspirin-Salicylamide-Caffeine (BC HEADACHE POWDER PO) Take 1 Package by mouth daily as needed (pain).    [provider]  ciprofloxacin (CILOXAN) 0.3 % ophthalmic solution Place 2 drops into the right eye 4 (four) times daily. for the next 5 days. 06/12/16   Renford DillsMiller, Lindsey, NP  Cyanocobalamin (VITAMIN B-12 PO) Take 1 tablet by mouth daily.    [provider]  doxycycline (VIBRAMYCIN) 100 MG capsule Take 1 capsule (100 mg total) by mouth 2 (two) times daily. 07/11/16 07/18/16  Nita SickleVeronese, [place], MD  HYDROcodone-acetaminophen (NORCO/VICODIN) 5-325 MG tablet 1 or 2 tabs PO q6 hours prn pain 12/10/14   Samuel JesterMcManus, Kathleen, DO  methocarbamol (ROBAXIN) 500 MG tablet Take 2 tablets (1,000 mg total) by mouth 4 (four) times daily as needed for muscle spasms (muscle spasm/pain). 12/10/14   Samuel JesterMcManus, Kathleen, DO  naproxen (NAPROSYN) 250 MG tablet Take 1 tablet (250 mg total) by mouth 2 (two) times daily as needed for mild pain or moderate pain (take with food). 12/10/14   Samuel JesterMcManus, Kathleen, DO  ondansetron (ZOFRAN ODT) 4 MG disintegrating tablet Take 1 tablet (4 mg total) by mouth every 8 (eight) hours as needed for nausea or vomiting. 07/11/16   Nita SickleVeronese, [place], MD  ranitidine (ZANTAC) 150 MG tablet Take 1 tablet (150 mg total) by mouth 2 (two) times daily. For stomach irritation. 03/23/15   Ivery QualeBryant, [place], PA-C    Allergies Eggs or egg-derived products; Penicillins; Iodine; and Latex  No family history on file.  Social History Social History  Substance Use Topics  . Smoking status: Current Every Day Smoker    Packs/day: 1.00    Types: Cigarettes  . Smokeless tobacco: Never Used  . Alcohol use No     Comment: occasional    Review of Systems  Constitutional: Negative for fever. + body aches, fatigue Eyes: Negative for visual changes. ENT: Negative for sore throat. + bleeding gums Neck: No neck pain  Cardiovascular: Negative  for chest pain. Respiratory: Negative for shortness of breath. Gastrointestinal: Negative for abdominal pain. + vomiting and diarrhea. Genitourinary: Negative for dysuria. Musculoskeletal: Negative for back pain. Skin: Negative for rash. Neurological: Negative for weakness or numbness. + HA Psych: No SI or HI  ____________________________________________   PHYSICAL EXAM:  VITAL SIGNS: ED Triage Vitals  Enc Vitals Group     BP 07/11/16 1638 130/88     Pulse Rate 07/11/16 1638 94     Resp 07/11/16 1638 18     Temp 07/11/16 1638 98.9 F (37.2 C)     Temp Source 07/11/16 1638 Oral     SpO2 07/11/16 1638 99 %     Weight 07/11/16 1634 135 lb (61.2 kg)     Height 07/11/16 1634 5\' 4"  (1.626 m)     Head Circumference --      Peak Flow --      Pain Score 07/11/16 1634 10     Pain Loc --      Pain Edu? --      Excl. in GC? --     Constitutional: Alert and oriented. Well appearing and in no apparent distress. HEENT:      Head: Normocephalic and atraumatic.         Eyes: Conjunctivae are normal. Sclera is non-icteric.       Mouth/Throat: Mucous membranes are moist. Good dentition with no bleeding gums      Neck: Supple with no signs of meningismus. No lymphadenopathy Cardiovascular: Regular rate and rhythm. No murmurs, gallops, or rubs. 2+ symmetrical distal pulses are present in all extremities. No JVD. Respiratory: Normal respiratory effort. Lungs are clear to auscultation bilaterally. No wheezes, crackles, or rhonchi.  Breast: normal bilateral breast with no masses, no nipple discharge. No axillary lymphadenopathy Gastrointestinal: Soft, non tender, and non distended with positive bowel sounds. No rebound or guarding. Musculoskeletal: Nontender with normal range of motion in all extremities. No edema, cyanosis, or erythema of extremities. Neurologic: Normal speech and language. Face is symmetric. Moving all extremities. No gross focal neurologic deficits are appreciated. Skin:  Skin is warm, dry and intact. No rash noted. Old scars from self inflicted cuts on the R forearm. There two sites on her back where ticks were found with no evidence of infection or rash Psychiatric: Mood and affect are normal. Speech and behavior are normal.  ____________________________________________   LABS (all labs ordered are listed, but only abnormal results are displayed)  Labs Reviewed  CBC WITH DIFFERENTIAL/PLATELET - Abnormal; Notable for the following:       Result Value   WBC 12.8 (*)    Neutro Abs 9.3 (*)    All other components within normal limits  URINALYSIS, COMPLETE (UACMP) WITH MICROSCOPIC - Abnormal; Notable for the following:    Color, Urine YELLOW (*)    APPearance CLOUDY (*)    Squamous Epithelial / LPF 0-5 (*)    All other components within normal limits  COMPREHENSIVE METABOLIC PANEL  TSH  CK  URINE DRUG SCREEN, QUALITATIVE ([facility] ONLY)  PREGNANCY, URINE  PROLACTIN  ROCKY MTN SPOTTED FVR ABS PNL(IGG+IGM)   ____________________________________________  EKG  none  ____________________________________________  RADIOLOGY  none  ____________________________________________   PROCEDURES  Procedure(s) performed: None Procedures Critical Care performed:  None ____________________________________________   INITIAL IMPRESSION / ASSESSMENT AND PLAN / ED COURSE   31 y.o. female with a history of bipolar disorder, anxiety, and depression who presents for evaluation of multiple medical complaints including generalized fatigue, weakness, diffuse body aches, intermittent swelling of her extremities, intermittent bleeding gums, diarrhea, vomiting. Patient has found 2 ticks on her however her symptoms precede the ticks. Patient is neurologically intact, well appearing, no distress, has normal vital signs, no meningeal signs, no rash, abdomen is soft and nontender, well-healing sites of tick bites with no evidence of rash. Will check CBC to eval platelets, CMP  to eval liver disease, prolactin to rule out prolactinoma, UA to rule out dehydration, TSh to rule out hypothyroidism, Utx to rule out ingestion, and RMSF IG abx. Will give IVF, reglan, and toradol for HA.    _________________________ 6:41 PM on 07/11/2016 ----------------------------------------- Blood work showing a slightly elevated white count of 12.8 which is consistent with a possible GI illness with vomiting and diarrhea. [facility]. spotted fever antibodies are send out and we will not get results at this time. Therefore we'll start treating patient with doxycycline for possible [facility]. spotted fever. Platelets and liver function within normal limits. No signs of dehydration or pregnancy. Tox screen is negative. Prolactin is pending and is also a send out. Recommend close follow-up at the [facility] clinic for further management of symptoms. HA is fully resolved after migraine cocktail.   Pertinent labs & imaging results that were available during my care of the patient were reviewed by me and considered in my medical decision making (see chart for details).    ____________________________________________   FINAL CLINICAL IMPRESSION(S) / ED DIAGNOSES  Final diagnoses:  Viral illness  Acute nonintractable headache, unspecified headache type  Vomiting and diarrhea  Breast discharge  Tick bite, initial encounter      NEW MEDICATIONS STARTED DURING THIS VISIT:  New Prescriptions   DOXYCYCLINE (VIBRAMYCIN) 100 MG CAPSULE    Take 1 capsule (100 mg total) by mouth 2 (two) times daily.   ONDANSETRON (ZOFRAN ODT) 4 MG DISINTEGRATING TABLET    Take 1 tablet (4 mg total) by mouth every 8 (eight) hours as needed for nausea or vomiting.     Note:  This document was prepared using Dragon voice recognition software and may include unintentional dictation errors.    Don Perking, [place], MD 07/11/16 [phone]

## 2016-07-12 LAB — PROLACTIN: PROLACTIN: 6.9 ng/mL (ref 4.8–23.3)

## 2016-07-14 LAB — ROCKY MTN SPOTTED FVR ABS PNL(IGG+IGM)
RMSF IGM: 0.32 {index} (ref 0.00–0.89)
RMSF IgG: NEGATIVE

## 2016-11-21 ENCOUNTER — Other Ambulatory Visit: Payer: Self-pay

## 2016-11-21 ENCOUNTER — Encounter: Payer: Self-pay | Admitting: Emergency Medicine

## 2016-11-21 ENCOUNTER — Emergency Department: Payer: Self-pay

## 2016-11-21 ENCOUNTER — Emergency Department
Admission: EM | Admit: 2016-11-21 | Discharge: 2016-11-21 | Disposition: A | Discharge status: Home / Self Care | Payer: Self-pay | Attending: Emergency Medicine | Admitting: Emergency Medicine

## 2016-11-21 ENCOUNTER — Ambulatory Visit
Admission: EM | Admit: 2016-11-21 | Discharge: 2016-11-21 | Disposition: A | Discharge status: Home / Self Care | Payer: Self-pay | Attending: Family Medicine | Admitting: Family Medicine

## 2016-11-21 ENCOUNTER — Encounter: Payer: Self-pay | Admitting: Intensive Care

## 2016-11-21 DIAGNOSIS — Z91040 Latex allergy status: Secondary | ICD-10-CM | POA: Insufficient documentation

## 2016-11-21 DIAGNOSIS — R110 Nausea: Secondary | ICD-10-CM

## 2016-11-21 DIAGNOSIS — F17210 Nicotine dependence, cigarettes, uncomplicated: Secondary | ICD-10-CM | POA: Insufficient documentation

## 2016-11-21 DIAGNOSIS — R1031 Right lower quadrant pain: Secondary | ICD-10-CM

## 2016-11-21 DIAGNOSIS — K529 Noninfective gastroenteritis and colitis, unspecified: Secondary | ICD-10-CM | POA: Insufficient documentation

## 2016-11-21 DIAGNOSIS — J45909 Unspecified asthma, uncomplicated: Secondary | ICD-10-CM | POA: Insufficient documentation

## 2016-11-21 DIAGNOSIS — Z79899 Other long term (current) drug therapy: Secondary | ICD-10-CM | POA: Insufficient documentation

## 2016-11-21 LAB — COMPREHENSIVE METABOLIC PANEL
ALBUMIN: 4.7 g/dL (ref 3.5–5.0)
ALK PHOS: 49 U/L (ref 38–126)
ALT: 22 U/L (ref 14–54)
ANION GAP: 12 (ref 5–15)
AST: 26 U/L (ref 15–41)
BILIRUBIN TOTAL: 0.3 mg/dL (ref 0.3–1.2)
BUN: 14 mg/dL (ref 6–20)
CALCIUM: 9.5 mg/dL (ref 8.9–10.3)
CO2: 26 mmol/L (ref 22–32)
Chloride: 101 mmol/L (ref 101–111)
Creatinine, Ser: 0.67 mg/dL (ref 0.44–1.00)
GFR calc non Af Amer: >60 mL/min (ref >60)
GLUCOSE: 108 mg/dL — AB (ref 65–99)
POTASSIUM: 4.4 mmol/L (ref 3.5–5.1)
Sodium: 139 mmol/L (ref 135–145)
TOTAL PROTEIN: 7.6 g/dL (ref 6.5–8.1)

## 2016-11-21 LAB — URINALYSIS, COMPLETE (UACMP) WITH MICROSCOPIC
BACTERIA UA: NONE SEEN
Bilirubin Urine: NEGATIVE
GLUCOSE, UA: NEGATIVE mg/dL
HGB URINE DIPSTICK: NEGATIVE
KETONES UR: NEGATIVE mg/dL
LEUKOCYTES UA: NEGATIVE
NITRITE: NEGATIVE
PROTEIN: 100 mg/dL — AB
Specific Gravity, Urine: 1.024 (ref 1.005–1.030)
pH: 7 (ref 5.0–8.0)

## 2016-11-21 LAB — CBC
HEMATOCRIT: 46.4 % (ref 35.0–47.0)
HEMOGLOBIN: 15.4 g/dL (ref 12.0–16.0)
MCH: 31.1 pg (ref 26.0–34.0)
MCHC: 33.2 g/dL (ref 32.0–36.0)
MCV: 93.7 fL (ref 80.0–100.0)
Platelets: 247 10*3/uL (ref 150–440)
RBC: 4.95 MIL/uL (ref 3.80–5.20)
RDW: 12.8 % (ref 11.5–14.5)
WBC: 16.3 10*3/uL — AB (ref 3.6–11.0)

## 2016-11-21 LAB — RAPID STREP SCREEN (MED CTR MEBANE ONLY): STREPTOCOCCUS, GROUP A SCREEN (DIRECT): NEGATIVE

## 2016-11-21 LAB — POC URINE PREG, ED: PREG TEST UR: NEGATIVE

## 2016-11-21 LAB — LIPASE, BLOOD: Lipase: 22 U/L (ref 11–51)

## 2016-11-21 MED ORDER — ONDANSETRON 4 MG PO TBDP: 4.0000 mg | ORAL_TABLET | Freq: Once | ORAL | Status: DC | PRN

## 2016-11-21 MED ORDER — ONDANSETRON 8 MG PO TBDP
8.0000 mg | ORAL_TABLET | Freq: Once | ORAL | Status: AC
  Administered 2016-11-21: 8 mg via ORAL

## 2016-11-21 MED ORDER — CIPROFLOXACIN HCL 500 MG PO TABS: 500.0000 mg | ORAL_TABLET | Freq: Two times a day (BID) | ORAL | 0 refills | Status: AC

## 2016-11-21 MED ORDER — ONDANSETRON HCL 4 MG/2ML IJ SOLN
4.0000 mg | Freq: Once | INTRAMUSCULAR | Status: AC
  Administered 2016-11-21: 4 mg via INTRAVENOUS
  Filled 2016-11-21: qty 2

## 2016-11-21 MED ORDER — HYDROCODONE-ACETAMINOPHEN 5-325 MG PO TABS: 1.0000 | ORAL_TABLET | ORAL | 0 refills | Status: DC | PRN

## 2016-11-21 MED ORDER — SODIUM CHLORIDE 0.9 % IV BOLUS (SEPSIS)
1000.0000 mL | Freq: Once | INTRAVENOUS | Status: AC
  Administered 2016-11-21: 1000 mL via INTRAVENOUS

## 2016-11-21 MED ORDER — ONDANSETRON 4 MG PO TBDP: 4.0000 mg | ORAL_TABLET | Freq: Three times a day (TID) | ORAL | 0 refills | Status: DC | PRN

## 2016-11-21 MED ORDER — MORPHINE SULFATE (PF) 4 MG/ML IV SOLN
4.0000 mg | Freq: Once | INTRAVENOUS | Status: AC
  Administered 2016-11-21: 4 mg via INTRAVENOUS
  Filled 2016-11-21: qty 1

## 2016-11-21 MED ORDER — METRONIDAZOLE 500 MG PO TABS: 500.0000 mg | ORAL_TABLET | Freq: Three times a day (TID) | ORAL | 0 refills | Status: AC

## 2016-11-21 NOTE — ED Triage Notes (Signed)
Pt from Urgent care,pt complains of nausea, vomiting, chills with RLQ abdominal pain

## 2016-11-21 NOTE — ED Provider Notes (Signed)
[facility]    CSN: [id number] Arrival date & time: 11/21/16  1148     History   Chief Complaint Chief Complaint  Patient presents with  . Emesis    HPI Isabel Robertson is a 31 y.o. female.   31 yo female with a c/o nausea, diarrhea, chills and abdominal pain (around the belly button and right lower abdomen) for 3 days and vomiting that started today. Denies any melena, hematochezia, hematemesis, dysuria, sick contacts, recent travel.    The history is provided by the patient.    Past Medical History:  Diagnosis Date  . Anxiety   . Asthma   . Bipolar 1 disorder (HCC)   . Seizures [facility](HCC)     Patient Active Problem List   Diagnosis Date Noted  . Vaginal bleeding 08/17/2014    Past Surgical History:  Procedure Laterality Date  . TUBAL LIGATION  11/2010    OB History    No data available       Home Medications    Prior to Admission medications   Medication Sig Start Date End Date Taking? Authorizing Provider  Aspirin-Salicylamide-Caffeine (BC HEADACHE POWDER PO) Take 1 Package by mouth daily as needed (pain).   Yes [provider]  acetaminophen (TYLENOL) 500 MG tablet Take 1,000 mg by mouth every 6 (six) hours as needed for pain.    [provider]  acetaminophen-codeine (TYLENOL #3) 300-30 MG tablet Take 1-2 tablets by mouth every 6 (six) hours as needed for moderate pain. 03/23/15   Ivery QualeBryant, [place], PA-C  ciprofloxacin (CILOXAN) 0.3 % ophthalmic solution Place 2 drops into the right eye 4 (four) times daily. for the next 5 days. 06/12/16   Renford DillsMiller, Lindsey, NP  Cyanocobalamin (VITAMIN B-12 PO) Take 1 tablet by mouth daily.    [provider]  HYDROcodone-acetaminophen (NORCO/VICODIN) 5-325 MG tablet 1 or 2 tabs PO q6 hours prn pain 12/10/14   Samuel JesterMcManus, Kathleen, DO  methocarbamol (ROBAXIN) 500 MG tablet Take 2 tablets (1,000 mg total) by mouth 4 (four) times daily as needed for muscle spasms (muscle spasm/pain). 12/10/14   Samuel JesterMcManus,  Kathleen, DO  naproxen (NAPROSYN) 250 MG tablet Take 1 tablet (250 mg total) by mouth 2 (two) times daily as needed for mild pain or moderate pain (take with food). 12/10/14   Samuel JesterMcManus, Kathleen, DO  ondansetron (ZOFRAN ODT) 4 MG disintegrating tablet Take 1 tablet (4 mg total) by mouth every 8 (eight) hours as needed for nausea or vomiting. 07/11/16   Nita SickleVeronese, [place], MD  ranitidine (ZANTAC) 150 MG tablet Take 1 tablet (150 mg total) by mouth 2 (two) times daily. For stomach irritation. 03/23/15   Ivery QualeBryant, Hobson, PA-C    Family History Family History  Problem Relation Age of Onset  . Hypertension Father     Social History Social History   Tobacco Use  . Smoking status: Current Every Day Smoker    Packs/day: 1.00    Types: Cigarettes  . Smokeless tobacco: Never Used  Substance Use Topics  . Alcohol use: Yes    Comment: occasional  . Drug use: No     Allergies   Eggs or egg-derived products; Penicillins; Iodine; and Latex   Review of Systems Review of Systems   Physical Exam Triage Vital Signs ED Triage Vitals  Enc Vitals Group     BP 11/21/16 1231 109/89     Pulse Rate 11/21/16 1231 100     Resp 11/21/16 1231 16     Temp 11/21/16 1231  98.3 F (36.8 C)     Temp Source 11/21/16 1231 Oral     SpO2 11/21/16 1231 99 %     Weight 11/21/16 1231 140 lb (63.5 kg)     Height 11/21/16 1231 5\' 3"  (1.6 m)     Head Circumference --      Peak Flow --      Pain Score 11/21/16 1232 9     Pain Loc --      Pain Edu? --      Excl. in GC? --    No data found.  Updated Vital Signs BP 109/89 (BP Location: Left Arm)   Pulse 100   Temp 98.3 F (36.8 C) (Oral)   Resp 16   Ht 5\' 3"  (1.6 m)   Wt 140 lb (63.5 kg)   LMP 11/12/2016   SpO2 99%   BMI 24.80 kg/m   Visual Acuity Right Eye Distance:   Left Eye Distance:   Bilateral Distance:    Right Eye Near:   Left Eye Near:    Bilateral Near:     Physical Exam  Constitutional: She appears well-developed and  well-nourished. No distress.  Cardiovascular: Normal rate, regular rhythm and normal heart sounds.  Pulmonary/Chest: Effort normal and breath sounds normal. No respiratory distress.  Abdominal: Soft. She exhibits no distension and no mass. There is tenderness (to right lower quadrant at McBurney's point). There is no rebound and no guarding.  Skin: She is not diaphoretic.  Nursing note and vitals reviewed.    UC Treatments / Results  Labs (all labs ordered are listed, but only abnormal results are displayed) Labs Reviewed  RAPID STREP SCREEN (NOT AT [facility])  CULTURE, GROUP A STREP [facility])    EKG  EKG Interpretation None       Radiology No results found.  Procedures Procedures (including critical care time)  Medications Ordered in UC Medications  ondansetron (ZOFRAN-ODT) disintegrating tablet 8 mg (8 mg Oral Given 11/21/16 1323)     Initial Impression / Assessment and Plan / UC Course  I have reviewed the triage vital signs and the nursing notes.  Pertinent labs & imaging results that were available during my care of the patient were reviewed by me and considered in my medical decision making (see chart for details).       Final Clinical Impressions(s) / UC Diagnoses   Final diagnoses:  Acute abdominal pain in right lower quadrant    ED Discharge Orders    None     Discussed with patient, recommendation to go to Emergency Department for further evaluation and management. Patient verbalizes understanding, in stable condition and will proceed by private vehicle with boyfriend driving. [facility] ED Triage RN.   Controlled Substance Prescriptions [place] Controlled Substance Registry consulted? Not Applicable   Payton Mccallumonty, Autum Benfer, MD 11/21/16 [phone]

## 2016-11-21 NOTE — Discharge Instructions (Signed)
Please take your antibiotics as prescribed for their entire course.  Please take your pain and nausea medication as needed, as written.  Please call the number provided for GI medicine to arrange a follow-up appointment as soon as possible to discuss further workup to rule out a possible inflammatory bowel disease such as Crohn's disease or ulcerative colitis.  Return to the emergency department for any fever, or if you begin vomiting unable to keep down your antibiotics or for any worsening pain.

## 2016-11-21 NOTE — ED Notes (Signed)
MD at bedside. 

## 2016-11-21 NOTE — ED Provider Notes (Signed)
[facility] Emergency Department Provider Note  Time seen: 3:44 PM  I have reviewed the triage vital signs and the nursing notes.   HISTORY  Chief Complaint Abdominal Pain (RLQ)    HPI Isabel Robertson is a 31 y.o. female with a past medical history of bipolar disorder, presents to the emergency department for right lower quadrant abdominal pain.  According to the patient for the past 3 days she has been expensing worsening right lower quadrant abdominal pain.  Went to urgent care today and was referred to the emergency department for further evaluation.  Patient states since last night she has been nauseated with vomiting and had diarrhea today.  Denies any known fever.  Denies any prior abdominal surgeries.  Denies vaginal bleeding or discharge.  Denies dysuria.  Describes her abdominal pain as moderate to severe in the right lower quadrant.  Past Medical History:  Diagnosis Date  . Anxiety   . Asthma   . Bipolar 1 disorder (HCC)   . Seizures [facility])     Patient Active Problem List   Diagnosis Date Noted  . Vaginal bleeding 08/17/2014    Past Surgical History:  Procedure Laterality Date  . TUBAL LIGATION  11/2010    Prior to Admission medications   Medication Sig Start Date End Date Taking? Authorizing Provider  acetaminophen (TYLENOL) 500 MG tablet Take 1,000 mg by mouth every 6 (six) hours as needed for pain.    [provider]  acetaminophen-codeine (TYLENOL #3) 300-30 MG tablet Take 1-2 tablets by mouth every 6 (six) hours as needed for moderate pain. 03/23/15   Ivery Quale, PA-C  Aspirin-Salicylamide-Caffeine (BC HEADACHE POWDER PO) Take 1 Package by mouth daily as needed (pain).    [provider]  ciprofloxacin (CILOXAN) 0.3 % ophthalmic solution Place 2 drops into the right eye 4 (four) times daily. for the next 5 days. 06/12/16   Renford Dills, NP  Cyanocobalamin (VITAMIN B-12 PO) Take 1 tablet by mouth daily.    [provider]  HYDROcodone-acetaminophen (NORCO/VICODIN) 5-325 MG tablet 1 or 2 tabs PO q6 hours prn pain 12/10/14   Samuel Jester, DO  methocarbamol (ROBAXIN) 500 MG tablet Take 2 tablets (1,000 mg total) by mouth 4 (four) times daily as needed for muscle spasms (muscle spasm/pain). 12/10/14   Samuel Jester, DO  naproxen (NAPROSYN) 250 MG tablet Take 1 tablet (250 mg total) by mouth 2 (two) times daily as needed for mild pain or moderate pain (take with food). 12/10/14   Samuel Jester, DO  ondansetron (ZOFRAN ODT) 4 MG disintegrating tablet Take 1 tablet (4 mg total) by mouth every 8 (eight) hours as needed for nausea or vomiting. 07/11/16   Nita Sickle, MD  ranitidine (ZANTAC) 150 MG tablet Take 1 tablet (150 mg total) by mouth 2 (two) times daily. For stomach irritation. 03/23/15   Ivery Quale, PA-C    Allergies  Allergen Reactions  . Eggs Or Egg-Derived Products Anaphylaxis  . Penicillins Anaphylaxis    Has patient had a PCN reaction causing immediate rash, facial/tongue/throat swelling, SOB or lightheadedness with hypotension: No Has patient had a PCN reaction causing severe rash involving mucus membranes or skin necrosis: No Has patient had a PCN reaction that required hospitalization No Has patient had a PCN reaction occurring within the last 10 years: No If all of the above answers are "NO", then may proceed with Cephalosporin use.   . Iodine Rash  . Latex Rash    Family History  Problem Relation Age of Onset  . Hypertension Father     Social History Social History   Tobacco Use  . Smoking status: Current Every Day Smoker    Packs/day: 1.00    Types: Cigarettes  . Smokeless tobacco: Never Used  Substance Use Topics  . Alcohol use: Yes    Comment: occasional  . Drug use: No    Review of Systems Constitutional: Negative for fever. Cardiovascular: Negative for chest pain. Respiratory: Negative for shortness of breath. Gastrointestinal: Right lower  quadrant pain.  Positive for nausea vomiting and diarrhea. Genitourinary: Negative for dysuria.  Negative for vaginal bleeding or discharge. Neurological: Negative for headache All other ROS negative  ____________________________________________   PHYSICAL EXAM:  VITAL SIGNS: ED Triage Vitals  Enc Vitals Group     BP 11/21/16 1417 119/88     Pulse Rate 11/21/16 1417 80     Resp 11/21/16 1417 16     Temp 11/21/16 1417 97.9 F (36.6 C)     Temp Source 11/21/16 1417 Oral     SpO2 11/21/16 1417 100 %     Weight 11/21/16 1419 140 lb (63.5 kg)     Height 11/21/16 1419 5\' 3"  (1.6 m)     Head Circumference --      Peak Flow --      Pain Score --      Pain Loc --      Pain Edu? --      Excl. in GC? --    Constitutional: Alert and oriented. Well appearing and in no distress. Eyes: Normal exam ENT   Head: Normocephalic and atraumatic.   Mouth/Throat: Mucous membranes are moist. Cardiovascular: Normal rate, regular rhythm. No murmur Respiratory: Normal respiratory effort without tachypnea nor retractions. Breath sounds are clear  Gastrointestinal: Soft, moderate right lower quadrant tenderness to palpation with mild guarding.  No rebound. Musculoskeletal: Nontender with normal range of motion in all extremities Neurologic:  Normal speech and language. No gross focal neurologic deficits Skin:  Skin is warm, dry and intact.  Psychiatric: Mood and affect are normal.     RADIOLOGY   IMPRESSION: 1. Imaging findings are consistent with pancolitis and mild terminal ileitis. This may be infectious or inflammatory in etiology. Inflammatory bowel disease not excluded. 2. No complicating features identified.  ____________________________________________   INITIAL IMPRESSION / ASSESSMENT AND PLAN / ED COURSE  Pertinent labs & imaging results that were available during my care of the patient were reviewed by me and considered in my medical decision making (see chart for  details).  Patient presents to the emergency department for right lower quadrant abdominal pain times 3 days now with nausea vomiting diarrhea.  Differential would include appendicitis, gastroenteritis, enteritis, viral illness, colitis or diverticulitis.  On examination patient has moderate tenderness to palpation with mild guarding concerning for possible appendicitis.  Patient's labs show a significant leukocytosis of 16,000, otherwise normal.  Pregnancy test is negative, urinalysis pending.  Given the patient's moderate tenderness with mild guarding and elevated white blood cell count will obtain a CT scan of the abdomen/pelvis to further evaluate.  Patient does have an iodine allergy we will perform a noncontrasted CT.  Patient agreeable to this plan of care.  I have reviewed the patient's records including multiple urgent care visits, as well as ER visits, largely noncontributory to today's ER visit.  Patient CT scan is negative for appendicitis but appears to show a pancolitis.  I discussed this with the patient.  She denies  any history of infectious or inflammatory bowel disease.  Denies any family history of ulcerative colitis or Crohn's disease.  Given the pancolitis I discussed with the patient admission to the hospital for IV antibiotics.  Patient's children are here with her and she strongly wishes to go home.  Patient states she is feeling much better after medications.  Since the patient wishes to be discharged home we will prescribe ciprofloxacin and Flagyl for the next 14 days as well as pain and nausea medication.  I discussed with the patient and husband if she begins vomiting unable to keep down antibiotics or develops a fever she is to return to the emergency department for evaluation and possible admission.  Patient agreeable to this plan of care.  She states she will call the GI doctor to arrange a follow-up appointment as soon as  possible.  ____________________________________________   FINAL CLINICAL IMPRESSION(S) / ED DIAGNOSES  Lower quadrant abdominal pain pancolitis   Minna AntisPaduchowski, Kaylob Wallen, MD 11/21/16 1649

## 2016-11-21 NOTE — ED Triage Notes (Signed)
Patient is very tender to RLQ x3 days. Diarrhea X3 days. Emesis X1 day and unable to keep fluids and food down today. Urgent care sent patient here concerned about appendicitis. Ambulatory with grimace on face due to pain in RLQ.

## 2016-11-21 NOTE — ED Triage Notes (Signed)
Patient in today c/o 3 day history of nausea w/ vomiting, diarrhea and sore throat. Patient states she has had chills and cold sweats, but hasn't taken her temperature.

## 2016-11-21 NOTE — ED Notes (Signed)
Patient transported to CT 

## 2016-11-21 NOTE — ED Triage Notes (Signed)
Patient states she has sharp abdominal pain off and on. 9/10 pain

## 2016-11-21 NOTE — Discharge Instructions (Addendum)
Discussed with patient recommendation to go to Emergency Department due to symptoms and physical exam concerning for possible appendicitis

## 2016-11-23 ENCOUNTER — Encounter: Payer: Self-pay | Admitting: Emergency Medicine

## 2016-11-23 ENCOUNTER — Other Ambulatory Visit: Payer: Self-pay

## 2016-11-23 ENCOUNTER — Emergency Department
Admission: EM | Admit: 2016-11-23 | Discharge: 2016-11-23 | Disposition: A | Discharge status: Home / Self Care | Payer: Self-pay | Attending: Emergency Medicine | Admitting: Emergency Medicine

## 2016-11-23 ENCOUNTER — Emergency Department: Payer: Self-pay

## 2016-11-23 DIAGNOSIS — Z79899 Other long term (current) drug therapy: Secondary | ICD-10-CM | POA: Insufficient documentation

## 2016-11-23 DIAGNOSIS — J45909 Unspecified asthma, uncomplicated: Secondary | ICD-10-CM | POA: Insufficient documentation

## 2016-11-23 DIAGNOSIS — K5900 Constipation, unspecified: Secondary | ICD-10-CM | POA: Insufficient documentation

## 2016-11-23 DIAGNOSIS — R109 Unspecified abdominal pain: Secondary | ICD-10-CM

## 2016-11-23 DIAGNOSIS — F17210 Nicotine dependence, cigarettes, uncomplicated: Secondary | ICD-10-CM | POA: Insufficient documentation

## 2016-11-23 DIAGNOSIS — Z91040 Latex allergy status: Secondary | ICD-10-CM | POA: Insufficient documentation

## 2016-11-23 LAB — POCT PREGNANCY, URINE: Preg Test, Ur: NEGATIVE

## 2016-11-23 LAB — COMPREHENSIVE METABOLIC PANEL
ALK PHOS: 43 U/L (ref 38–126)
ALT: 32 U/L (ref 14–54)
ANION GAP: 9 (ref 5–15)
AST: 37 U/L (ref 15–41)
Albumin: 4.3 g/dL (ref 3.5–5.0)
BUN: 11 mg/dL (ref 6–20)
CALCIUM: 9.4 mg/dL (ref 8.9–10.3)
CO2: 25 mmol/L (ref 22–32)
Chloride: 102 mmol/L (ref 101–111)
Creatinine, Ser: 0.77 mg/dL (ref 0.44–1.00)
Glucose, Bld: 114 mg/dL — ABNORMAL HIGH (ref 65–99)
Potassium: 3.8 mmol/L (ref 3.5–5.1)
SODIUM: 136 mmol/L (ref 135–145)
Total Bilirubin: 0.4 mg/dL (ref 0.3–1.2)
Total Protein: 7.5 g/dL (ref 6.5–8.1)

## 2016-11-23 LAB — URINALYSIS, COMPLETE (UACMP) WITH MICROSCOPIC
BILIRUBIN URINE: NEGATIVE
GLUCOSE, UA: NEGATIVE mg/dL
HGB URINE DIPSTICK: NEGATIVE
KETONES UR: NEGATIVE mg/dL
Nitrite: NEGATIVE
PH: 7 (ref 5.0–8.0)
Protein, ur: NEGATIVE mg/dL
RBC / HPF: NONE SEEN RBC/hpf (ref 0–5)
SPECIFIC GRAVITY, URINE: 1.016 (ref 1.005–1.030)
WBC UA: NONE SEEN WBC/hpf (ref 0–5)

## 2016-11-23 LAB — CBC
HCT: 44.1 % (ref 35.0–47.0)
HEMOGLOBIN: 15 g/dL (ref 12.0–16.0)
MCH: 31.8 pg (ref 26.0–34.0)
MCHC: 33.9 g/dL (ref 32.0–36.0)
MCV: 93.7 fL (ref 80.0–100.0)
Platelets: 235 10*3/uL (ref 150–440)
RBC: 4.71 MIL/uL (ref 3.80–5.20)
RDW: 12.9 % (ref 11.5–14.5)
WBC: 7.5 10*3/uL (ref 3.6–11.0)

## 2016-11-23 LAB — LIPASE, BLOOD: Lipase: 25 U/L (ref 11–51)

## 2016-11-23 MED ORDER — BARIUM SULFATE 2.1 % PO SUSP
450.0000 mL | ORAL | Status: AC
  Administered 2016-11-23: 450 mL via ORAL

## 2016-11-23 MED ORDER — ONDANSETRON HCL 4 MG/2ML IJ SOLN
4.0000 mg | Freq: Once | INTRAMUSCULAR | Status: AC
  Administered 2016-11-23: 4 mg via INTRAVENOUS

## 2016-11-23 MED ORDER — ONDANSETRON HCL 4 MG/2ML IJ SOLN
INTRAMUSCULAR | Status: AC
  Filled 2016-11-23: qty 2

## 2016-11-23 MED ORDER — MORPHINE SULFATE (PF) 4 MG/ML IV SOLN
INTRAVENOUS | Status: AC
  Filled 2016-11-23: qty 1

## 2016-11-23 MED ORDER — MORPHINE SULFATE (PF) 4 MG/ML IV SOLN
4.0000 mg | Freq: Once | INTRAVENOUS | Status: AC
  Administered 2016-11-23: 4 mg via INTRAVENOUS

## 2016-11-23 NOTE — ED Notes (Signed)
Pt transported to CT ?

## 2016-11-23 NOTE — ED Triage Notes (Signed)
Seen Wednesday for "stomach problems"  Returns today with worsening pain and abdominal pain.

## 2016-11-23 NOTE — Discharge Instructions (Signed)
The inflammation on the CAT scan is much better. The white blood count is lower. I would finish the antibiotics. Return for increasing fever or if the belly pain worsens. You are constipated now. I would increase your intake of fluids a little bit and try a mild over-the-counter laxative just once or twice. You probably will have some cramping with that but once you have good stool you should feel a lot better.

## 2016-11-23 NOTE — ED Provider Notes (Signed)
[facility] Emergency Department Provider Note   ____________________________________________   First MD Initiated Contact with Patient 11/23/16 1830     (approximate)  I have reviewed the triage vital signs and the nursing notes.   HISTORY  Chief Complaint Abdominal Pain    HPI Isabel Robertson is a 31 y.o. female Patient reports she has not had any stool since 2 days ago when she was seen in the emergency room diagnosed with pancolitis and terminal ileitis. She is not vomiting anymore and she is able to keep down fluids. She reports drinking anything but a few sips of water makes her stomach began to cramp. She is not running a fever and the pain has gotten worse. She says she feels miserable. Pain is moderately severe worse with palpation and percussion.   Past Medical History:  Diagnosis Date  . Anxiety   . Asthma   . Bipolar 1 disorder (HCC)   . Seizures [facility](HCC)     Patient Active Problem List   Diagnosis Date Noted  . Vaginal bleeding 08/17/2014    Past Surgical History:  Procedure Laterality Date  . TUBAL LIGATION  11/2010    Prior to Admission medications   Medication Sig Start Date End Date Taking? Authorizing Provider  acetaminophen (TYLENOL) 500 MG tablet Take 1,000 mg by mouth every 6 (six) hours as needed for pain.    [provider]  acetaminophen-codeine (TYLENOL #3) 300-30 MG tablet Take 1-2 tablets by mouth every 6 (six) hours as needed for moderate pain. 03/23/15   Ivery QualeBryant, Hobson, PA-C  Aspirin-Salicylamide-Caffeine (BC HEADACHE POWDER PO) Take 1 Package by mouth daily as needed (pain).    [provider]  ciprofloxacin (CILOXAN) 0.3 % ophthalmic solution Place 2 drops into the right eye 4 (four) times daily. for the next 5 days. 06/12/16   Renford DillsMiller, Lindsey, NP  ciprofloxacin (CIPRO) 500 MG tablet Take 1 tablet (500 mg total) 2 (two) times daily for 14 days by mouth. 11/21/16 12/05/16  Minna AntisPaduchowski, Kevin, MD    Cyanocobalamin (VITAMIN B-12 PO) Take 1 tablet by mouth daily.    [provider]  HYDROcodone-acetaminophen (NORCO/VICODIN) 5-325 MG tablet Take 1 tablet every 4 (four) hours as needed by mouth. 11/21/16   Minna AntisPaduchowski, Kevin, MD  methocarbamol (ROBAXIN) 500 MG tablet Take 2 tablets (1,000 mg total) by mouth 4 (four) times daily as needed for muscle spasms (muscle spasm/pain). 12/10/14   Samuel JesterMcManus, Kathleen, DO  metroNIDAZOLE (FLAGYL) 500 MG tablet Take 1 tablet (500 mg total) 3 (three) times daily for 14 days by mouth. 11/21/16 12/05/16  Minna AntisPaduchowski, Kevin, MD  naproxen (NAPROSYN) 250 MG tablet Take 1 tablet (250 mg total) by mouth 2 (two) times daily as needed for mild pain or moderate pain (take with food). 12/10/14   Samuel JesterMcManus, Kathleen, DO  ondansetron (ZOFRAN ODT) 4 MG disintegrating tablet Take 1 tablet (4 mg total) every 8 (eight) hours as needed by mouth for nausea or vomiting. 11/21/16   Minna AntisPaduchowski, Kevin, MD  ranitidine (ZANTAC) 150 MG tablet Take 1 tablet (150 mg total) by mouth 2 (two) times daily. For stomach irritation. 03/23/15   Ivery QualeBryant, Hobson, PA-C    Allergies Eggs or egg-derived products; Penicillins; Iodine; and Latex  Family History  Problem Relation Age of Onset  . Hypertension Father     Social History Social History   Tobacco Use  . Smoking status: Current Every Day Smoker    Packs/day: 1.00    Types: Cigarettes  . Smokeless tobacco:  Never Used  Substance Use Topics  . Alcohol use: Yes    Comment: occasional  . Drug use: No    Review of Systems  Constitutional: No fever/chills Eyes: No visual changes. ENT: No sore throat. Cardiovascular: Denies chest pain. Respiratory: Denies shortness of breath. Gastrointestinal:see history of present illness Genitourinary: Negative for dysuria. Musculoskeletal: Negative for back pain. Skin: Negative for rash. Neurological: Negative for headaches, focal weakness    ____________________________________________   PHYSICAL EXAM:  VITAL SIGNS: ED Triage Vitals  Enc Vitals Group     BP 11/23/16 1737 129/85     Pulse Rate 11/23/16 1737 74     Resp 11/23/16 1737 16     Temp 11/23/16 1737 98.1 F (36.7 C)     Temp Source 11/23/16 1737 Oral     SpO2 11/23/16 1737 100 %     Weight 11/23/16 1735 140 lb (63.5 kg)     Height 11/23/16 1735 5\' 3"  (1.6 m)     Head Circumference --      Peak Flow --      Pain Score 11/23/16 1735 10     Pain Loc --      Pain Edu? --      Excl. in GC? --     Constitutional: Alert and oriented. Well appearing and in no acute distress. Eyes: Conjunctivae are normal. PERRL. EOMI. Head: Atraumatic. Nose: No congestion/rhinnorhea. Mouth/Throat: Mucous membranes are moist.  Oropharynx non-erythematous. Neck: No stridor. Cardiovascular: Normal rate, regular rhythm. Grossly normal heart sounds.  Good peripheral circulation. Respiratory: Normal respiratory effort.  No retractions. Lungs CTAB. Gastrointestinal: Soft diffusely tender to palpation and percussion positive bowel sounds some distention. No abdominal bruits. No CVA tenderness. Musculoskeletal: No lower extremity tenderness nor edema.  No joint effusions. Neurologic:  Normal speech and language. No gross focal neurologic deficits are appreciated.  Skin:  Skin is warm, dry and intact. No rash noted. Psychiatric: Mood and affect are normal. Speech and behavior are normal.  ____________________________________________   LABS (all labs ordered are listed, but only abnormal results are displayed)  Labs Reviewed  COMPREHENSIVE METABOLIC PANEL - Abnormal; Notable for the following components:      Result Value   Glucose, Bld 114 (*)    All other components within normal limits  URINALYSIS, COMPLETE (UACMP) WITH MICROSCOPIC - Abnormal; Notable for the following components:   Color, Urine YELLOW (*)    APPearance CLOUDY (*)    Leukocytes, UA TRACE (*)    Bacteria,  UA RARE (*)    Squamous Epithelial / LPF 6-30 (*)    All other components within normal limits  LIPASE, BLOOD  CBC  POCT PREGNANCY, URINE   ____________________________________________  EKG   ____________________________________________  RADIOLOGY  CT read by radiology is improved although now there is some stool in the colon ____________________________________________   PROCEDURES  Procedure(s) performed:  Procedures  Critical Care performed:  ____________________________________________   INITIAL IMPRESSION / ASSESSMENT AND PLAN / ED COURSE  As part of my medical decision making, I reviewed the following data within the electronic MEDICAL RECORD NUMBER     with the improving CT scan showing some fecal loading and improving white count I believe that the belly pain is most likely due to constipation.I will inform the patient and have her increase her fluid intake and try a mild laxative and/or enema.   ____________________________________________   FINAL CLINICAL IMPRESSION(S) / ED DIAGNOSES  Final diagnoses:  Abdominal pain, unspecified abdominal location  Constipation, unspecified  constipation type     ED Discharge Orders    None       Note:  This document was prepared using Dragon voice recognition software and may include unintentional dictation errors.    Arnaldo NatalMalinda, Paul F, MD 11/23/16 2219

## 2016-11-24 LAB — CULTURE, GROUP A STREP (THRC)

## 2017-06-23 ENCOUNTER — Emergency Department (HOSPITAL_COMMUNITY)
Admission: EM | Admit: 2017-06-23 | Discharge: 2017-06-23 | Disposition: A | Discharge status: Home / Self Care | Payer: Self-pay | Attending: Emergency Medicine | Admitting: Emergency Medicine

## 2017-06-23 ENCOUNTER — Other Ambulatory Visit: Payer: Self-pay

## 2017-06-23 ENCOUNTER — Emergency Department (HOSPITAL_COMMUNITY): Payer: Self-pay

## 2017-06-23 ENCOUNTER — Encounter (HOSPITAL_COMMUNITY): Payer: Self-pay | Admitting: Emergency Medicine

## 2017-06-23 DIAGNOSIS — Y9311 Activity, swimming: Secondary | ICD-10-CM | POA: Insufficient documentation

## 2017-06-23 DIAGNOSIS — Z79899 Other long term (current) drug therapy: Secondary | ICD-10-CM | POA: Insufficient documentation

## 2017-06-23 DIAGNOSIS — Z91040 Latex allergy status: Secondary | ICD-10-CM | POA: Insufficient documentation

## 2017-06-23 DIAGNOSIS — Y999 Unspecified external cause status: Secondary | ICD-10-CM | POA: Insufficient documentation

## 2017-06-23 DIAGNOSIS — S99192A Other physeal fracture of left metatarsal, initial encounter for closed fracture: Secondary | ICD-10-CM

## 2017-06-23 DIAGNOSIS — J45909 Unspecified asthma, uncomplicated: Secondary | ICD-10-CM | POA: Insufficient documentation

## 2017-06-23 DIAGNOSIS — W2209XA Striking against other stationary object, initial encounter: Secondary | ICD-10-CM | POA: Insufficient documentation

## 2017-06-23 DIAGNOSIS — S92355A Nondisplaced fracture of fifth metatarsal bone, left foot, initial encounter for closed fracture: Secondary | ICD-10-CM | POA: Insufficient documentation

## 2017-06-23 DIAGNOSIS — Y929 Unspecified place or not applicable: Secondary | ICD-10-CM | POA: Insufficient documentation

## 2017-06-23 DIAGNOSIS — F17210 Nicotine dependence, cigarettes, uncomplicated: Secondary | ICD-10-CM | POA: Insufficient documentation

## 2017-06-23 MED ORDER — METHOCARBAMOL 500 MG PO TABS: 1000.0000 mg | ORAL_TABLET | Freq: Four times a day (QID) | ORAL | 0 refills | Status: DC | PRN

## 2017-06-23 MED ORDER — HYDROCODONE-ACETAMINOPHEN 5-325 MG PO TABS: 1.0000 | ORAL_TABLET | Freq: Four times a day (QID) | ORAL | 0 refills | Status: DC | PRN

## 2017-06-23 MED ORDER — HYDROCODONE-ACETAMINOPHEN 5-325 MG PO TABS
1.0000 | ORAL_TABLET | Freq: Once | ORAL | Status: AC
  Administered 2017-06-23: 1 via ORAL
  Filled 2017-06-23: qty 1

## 2017-06-23 NOTE — Discharge Instructions (Signed)
You have a broken bone your foot, likely a Jones fracture.  Do not bear any weight.  Use crutches to ambulate. Please go to orthopedist Dr. Mort SawyersHarrison's office tomorrow at 8:30am for further care.

## 2017-06-23 NOTE — ED Provider Notes (Signed)
[facility] EMERGENCY DEPARTMENT Provider Note   CSN: [id number] Arrival date & time: 06/23/17  7829     History   Chief Complaint Chief Complaint  Patient presents with  . Foot Pain    HPI Kash Mothershead is a 32 y.o. female.  HPI   32 year old female with history of bipolar, anxiety, asthma presenting complaining of foot injury.  Earlier today, patient was swimming and white water drafting in the river. She accidentally struck her left foot against a rock or hard surface.  She reported immediate sharp throbbing pain to the foot, which has since intensified with associated swelling and bruising.  Pain is moderate to severe, nonradiating, worse with ambulation.  She tries icing her foot with minimal improvement.  She denies any ankle or knee pain.  Denies any other injury.  Past Medical History:  Diagnosis Date  . Anxiety   . Asthma   . Bipolar 1 disorder (HCC)   . Seizures [facility])     Patient Active Problem List   Diagnosis Date Noted  . Vaginal bleeding 08/17/2014    Past Surgical History:  Procedure Laterality Date  . TUBAL LIGATION  11/2010     OB History   None      Home Medications    Prior to Admission medications   Medication Sig Start Date End Date Taking? Authorizing Provider  acetaminophen (TYLENOL) 500 MG tablet Take 1,000 mg by mouth every 6 (six) hours as needed for pain.    [provider]  acetaminophen-codeine (TYLENOL #3) 300-30 MG tablet Take 1-2 tablets by mouth every 6 (six) hours as needed for moderate pain. 03/23/15   Ivery Quale, PA-C  Aspirin-Salicylamide-Caffeine (BC HEADACHE POWDER PO) Take 1 Package by mouth daily as needed (pain).    [provider]  ciprofloxacin (CILOXAN) 0.3 % ophthalmic solution Place 2 drops into the right eye 4 (four) times daily. for the next 5 days. 06/12/16   Renford Dills, NP  Cyanocobalamin (VITAMIN B-12 PO) Take 1 tablet by mouth daily.    [provider]  HYDROcodone-acetaminophen  (NORCO/VICODIN) 5-325 MG tablet Take 1 tablet every 4 (four) hours as needed by mouth. 11/21/16   [name], MD  methocarbamol (ROBAXIN) 500 MG tablet Take 2 tablets (1,000 mg total) by mouth 4 (four) times daily as needed for muscle spasms (muscle spasm/pain). 12/10/14   Samuel Jester, DO  naproxen (NAPROSYN) 250 MG tablet Take 1 tablet (250 mg total) by mouth 2 (two) times daily as needed for mild pain or moderate pain (take with food). 12/10/14   Samuel Jester, DO  ondansetron (ZOFRAN ODT) 4 MG disintegrating tablet Take 1 tablet (4 mg total) every 8 (eight) hours as needed by mouth for nausea or vomiting. 11/21/16   [name], MD  ranitidine (ZANTAC) 150 MG tablet Take 1 tablet (150 mg total) by mouth 2 (two) times daily. For stomach irritation. 03/23/15   Ivery Quale, PA-C    Family History Family History  Problem Relation Age of Onset  . Hypertension Father     Social History Social History   Tobacco Use  . Smoking status: Current Every Day Smoker    Packs/day: 1.00    Types: Cigarettes  . Smokeless tobacco: Never Used  Substance Use Topics  . Alcohol use: Yes    Comment: occasional  . Drug use: No     Allergies   Eggs or egg-derived products; Penicillins; Iodine; and Latex   Review of Systems Review of Systems  Constitutional: Negative  for fever.  Musculoskeletal: Positive for arthralgias.  Skin: Negative for rash and wound.  Neurological: Negative for numbness.     Physical Exam Updated Vital Signs BP 127/86   Pulse (!) 108   Temp 98.4 F (36.9 C)   Resp 18   Ht 5\' 4"  (1.626 m)   Wt 65.8 kg (145 lb)   LMP 06/16/2017   SpO2 100%   BMI 24.89 kg/m   Physical Exam  Constitutional: She appears well-developed and well-nourished. No distress.  HENT:  Head: Atraumatic.  Eyes: Conjunctivae are normal.  Neck: Neck supple.  Musculoskeletal: She exhibits tenderness (Left foot: Ecchymosis noted to the dorsum of the foot laterally  approximately 6 x 3 cm with exquisite tenderness to palpation and associated edema.  Dorsalis pedis pulse palpable, brisk cap refill.).  Left ankle nontender to palpation.  Neurological: She is alert.  Skin: No rash noted.  Psychiatric: She has a normal mood and affect.  Nursing note and vitals reviewed.    ED Treatments / Results  Labs (all labs ordered are listed, but only abnormal results are displayed) Labs Reviewed - No data to display  EKG None  Radiology No results found.  Procedures Procedures (including critical care time)  Medications Ordered in ED Medications - No data to display   Initial Impression / Assessment and Plan / ED Course  I have reviewed the triage vital signs and the nursing notes.  Pertinent labs & imaging results that were available during my care of the patient were reviewed by me and considered in my medical decision making (see chart for details).     BP 127/86   Pulse (!) 108   Temp 98.4 F (36.9 C)   Resp 18   Ht 5\' 4"  (1.626 m)   Wt 65.8 kg (145 lb)   LMP 06/16/2017   SpO2 100%   BMI 24.89 kg/m    Final Clinical Impressions(s) / ED Diagnoses   Final diagnoses:  Closed fracture of base of fifth metatarsal bone of left foot at metaphyseal-diaphyseal junction, initial encounter    ED Discharge Orders        Ordered    HYDROcodone-acetaminophen (NORCO/VICODIN) 5-325 MG tablet  Every 6 hours PRN     06/23/17 2032    methocarbamol (ROBAXIN) 500 MG tablet  4 times daily PRN     06/23/17 2032     7:23 PM Patient injured her left foot when she struck it against a rock.  She has bruising to the dorsum of her foot.  Will obtain x-ray to rule out acute fracture.  There is no break in the skin.  Pain medication given.  Ice pack applied.  8:24 PM X-ray of the left foot demonstrate nondisplaced fracture near the base of the left fifth metatarsal bone.  It is suggested that this is likely a Jones fracture.  I did discuss this finding  with Dr. Clarene DukeMcManus, and I am also consulted on-call orthopedist, Dr. Romeo AppleHarrison who recommend patient to be nonweightbearing, in a posterior splint and will follow-up in his office at 8:30 AM tomorrow.  Patient made aware of findings and agrees with plan.   Fayrene Helperran, Milbert Bixler, PA-C 06/23/17 2057    Samuel JesterMcManus, Kathleen, DO 06/27/17 [phone]

## 2017-06-23 NOTE — ED Triage Notes (Signed)
Pt c/o left foot pain. 

## 2017-06-24 ENCOUNTER — Encounter: Payer: Self-pay | Admitting: Orthopedic Surgery

## 2017-06-24 ENCOUNTER — Ambulatory Visit (INDEPENDENT_AMBULATORY_CARE_PROVIDER_SITE_OTHER): Payer: Self-pay | Admitting: Orthopedic Surgery

## 2017-06-24 VITALS — BP 128/90 | HR 105 | Ht 64.0 in

## 2017-06-24 DIAGNOSIS — S92352A Displaced fracture of fifth metatarsal bone, left foot, initial encounter for closed fracture: Secondary | ICD-10-CM

## 2017-06-24 NOTE — Patient Instructions (Signed)
No weightbearing for 8 weeks  Out of work 8 weeks  Continue to ice the foot and elevated times the next 72 hours and then as needed for swelling   GET  your pain medication prescription refilled and call us for any additional refills, you can take opioid medication hydrocodone for 2 weeks and then changed to ibuprofen and Tylenol

## 2017-06-24 NOTE — Progress Notes (Signed)
NEW PATIENT OFFICE VISIT   Chief Complaint  Patient presents with  . Foot Injury    left foot 5th MT fracture 06/21/17     MEDICAL DECISION SECTION  Xrays were done at [facility] there is a Jones fracture nondisplaced left fifth metatarsal  My independent reading of xrays:  Jones fracture nondisplaced left fifth metatarsal  Encounter Diagnosis  Name Primary?  . Closed displaced fracture of fifth metatarsal bone of left foot, initial encounter//June 16 Yes    PLAN: (Rx., injectx, surgery, frx, mri/ct) Patient has significant swelling patient replaced in a splint until swelling goes down  Reassess in 1 week, cast application  No weightbearing  No orders of the defined types were placed in this encounter.   Chief Complaint  Patient presents with  . 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  She injured the left foot on June 16 she complains of nonradiating pain and swelling lateral aspect left foot and painful weightbearing.  She is a smoker   Review of Systems  Skin:       Great toe abrasion from the injury  Neurological: Negative for tingling.  All other systems reviewed and are negative.    Past Medical History:  Diagnosis Date  . Anxiety   . Asthma   . Bipolar 1 disorder (HCC)   . Seizures (HCC)     Past Surgical History:  Procedure Laterality Date  . TUBAL LIGATION  11/2010    Family History  Problem Relation Age of Onset  . Hypertension Father    Social History   Tobacco Use  . Smoking status: Current Every Day Smoker    Packs/day: 1.00    Types: Cigarettes  . Smokeless tobacco: Never Used  Substance Use Topics  . Alcohol use: Yes    Comment: occasional  . Drug use: No    Allergies  Allergen Reactions  . Eggs Or  Egg-Derived Products Anaphylaxis  . Penicillins Anaphylaxis    Has patient had a PCN reaction causing immediate rash, facial/tongue/throat swelling, SOB or lightheadedness with hypotension: No Has patient had a PCN reaction causing severe rash involving mucus membranes or skin necrosis: No Has patient had a PCN reaction that required hospitalization No Has patient had a PCN reaction occurring within the last 10 years: No If all of the above answers are "NO", then may proceed with Cephalosporin use.   . Iodine Rash  . Latex Rash    No outpatient medications have been marked as taking for the 06/24/17 encounter (Office Visit) with Vickki HearingHarrison, Cathlin Buchan E, MD.    BP 128/90   Pulse (!) 105   Ht 5\' 4"  (1.626 m)   LMP 06/16/2017   BMI 24.89 kg/m   Physical Exam  Constitutional: She is oriented to person, place, and time. She appears well-developed and well-nourished.  Neurological: She is alert and oriented to person, place, and time. Gait abnormal.  Crutches nonweightbearing  Psychiatric: She has a normal mood and affect. Judgment normal.  Vitals reviewed.   Ortho Exam  Left foot inspection swelling ecchymosis and tenderness on the lateral portion of the foot near the base of the fifth metatarsal Range of motion in the ankle normal Strength normal muscle tone no atrophy or  tremor Ankle stable Skin ecchymotic abrasion great toe Sensation is normal Pulses normal  Right foot alignment normal no swelling    Fuller Canada, MD  06/24/2017 9:03 AM

## 2017-06-28 DIAGNOSIS — S92355D Nondisplaced fracture of fifth metatarsal bone, left foot, subsequent encounter for fracture with routine healing: Secondary | ICD-10-CM | POA: Insufficient documentation

## 2017-07-01 ENCOUNTER — Ambulatory Visit: Payer: Self-pay | Admitting: Orthopedic Surgery

## 2017-07-01 VITALS — BP 114/74 | HR 81 | Ht 64.0 in | Wt 145.0 lb

## 2017-07-01 DIAGNOSIS — S92355D Nondisplaced fracture of fifth metatarsal bone, left foot, subsequent encounter for fracture with routine healing: Secondary | ICD-10-CM

## 2017-07-01 MED ORDER — HYDROCODONE-ACETAMINOPHEN 5-325 MG PO TABS: 1.0000 | ORAL_TABLET | Freq: Four times a day (QID) | ORAL | 0 refills | Status: AC | PRN

## 2017-07-01 NOTE — Progress Notes (Addendum)
Fracture care follow-up  Chief Complaint  Patient presents with  . Follow-up    Recheck on left foot fracture, DOI 06-21-17.    Left fifth metatarsal fracture 32 year old female injured the foot June 16 helping her daughter who fell into the water off of her horse  Her foot was too swollen last week to cast  She will get a short cam walker   No weightbearing  X-ray in 6 WEEKS  Meds ordered this encounter  Medications  . HYDROcodone-acetaminophen (NORCO/VICODIN) 5-325 MG tablet    Sig: Take 1 tablet by mouth every 6 (six) hours as needed for up to 5 days for moderate pain or severe pain.    Dispense:  20 tablet    Refill:  0   Addendum patient opted for cast because of cost  No weightbearing x-ray 6 weeks

## 2017-08-16 ENCOUNTER — Ambulatory Visit: Payer: Self-pay | Admitting: Orthopedic Surgery

## 2017-08-16 ENCOUNTER — Telehealth: Payer: Self-pay | Admitting: Orthopedic Surgery

## 2017-08-22 ENCOUNTER — Emergency Department
Admission: EM | Admit: 2017-08-22 | Discharge: 2017-08-22 | Disposition: A | Discharge status: Home / Self Care | Payer: Self-pay | Attending: Emergency Medicine | Admitting: Emergency Medicine

## 2017-08-22 DIAGNOSIS — Z91040 Latex allergy status: Secondary | ICD-10-CM | POA: Insufficient documentation

## 2017-08-22 DIAGNOSIS — F319 Bipolar disorder, unspecified: Secondary | ICD-10-CM | POA: Insufficient documentation

## 2017-08-22 DIAGNOSIS — T7840XA Allergy, unspecified, initial encounter: Secondary | ICD-10-CM | POA: Insufficient documentation

## 2017-08-22 DIAGNOSIS — J45909 Unspecified asthma, uncomplicated: Secondary | ICD-10-CM | POA: Insufficient documentation

## 2017-08-22 DIAGNOSIS — Z79899 Other long term (current) drug therapy: Secondary | ICD-10-CM | POA: Insufficient documentation

## 2017-08-22 DIAGNOSIS — F17210 Nicotine dependence, cigarettes, uncomplicated: Secondary | ICD-10-CM | POA: Insufficient documentation

## 2017-08-22 DIAGNOSIS — F419 Anxiety disorder, unspecified: Secondary | ICD-10-CM | POA: Insufficient documentation

## 2017-08-22 MED ORDER — DOXYCYCLINE HYCLATE 100 MG PO TABS: 100.0000 mg | ORAL_TABLET | Freq: Two times a day (BID) | ORAL | 0 refills | Status: DC

## 2017-08-22 MED ORDER — PREDNISONE 20 MG PO TABS
60.0000 mg | ORAL_TABLET | Freq: Once | ORAL | Status: AC
  Administered 2017-08-22: 60 mg via ORAL
  Filled 2017-08-22: qty 3

## 2017-08-22 MED ORDER — DOXYCYCLINE HYCLATE 100 MG PO TABS
100.0000 mg | ORAL_TABLET | Freq: Once | ORAL | Status: AC
  Administered 2017-08-22: 100 mg via ORAL
  Filled 2017-08-22: qty 1

## 2017-08-22 MED ORDER — PREDNISONE 20 MG PO TABS: 40.0000 mg | ORAL_TABLET | Freq: Every day | ORAL | 0 refills | Status: DC

## 2017-08-22 MED ORDER — DIPHENHYDRAMINE HCL 25 MG PO CAPS
50.0000 mg | ORAL_CAPSULE | Freq: Once | ORAL | Status: AC
Start: 2017-08-22 — End: 2017-08-22
  Administered 2017-08-22: 50 mg via ORAL
  Filled 2017-08-22: qty 2

## 2017-08-22 NOTE — ED Provider Notes (Addendum)
[facility] Emergency Department Provider Note  Time seen: 10:23 PM  I have reviewed the triage vital signs and the nursing notes.   HISTORY  Chief Complaint Allergic Reaction    HPI Isabel Robertson is a 32 y.o. female with a past medical history of anxiety, bipolar and seizure disorder who presents to the emergency department for left hand arm pain and swelling.  According to the patient she was stung by a wasp last night.  Patient states she saw the wasp sting her.  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  States she has had increased swelling in her hand and now coming up her forearm today with mild redness of the area as well.  Has been using Benadryl at home but states her symptoms continue to worsen so she came to the emergency department for evaluation.  Denies any nausea or vomiting at any point.  Denies any trouble breathing sensation of swelling in the throat or mouth at any point.   Past Medical History:  Diagnosis Date  . Anxiety   . Asthma   . Bipolar 1 disorder (HCC)   . Seizures [facility](HCC)     Patient Active Problem List   Diagnosis Date Noted  . Nondisplaced fracture of fifth metatarsal bone, left foot, subsequent encounter for fracture with routine healing 06/23/17 06/28/2017  . Vaginal bleeding 08/17/2014    Past Surgical History:  Procedure Laterality Date  . TUBAL LIGATION  11/2010    Prior to Admission medications   Medication Sig Start Date End Date Taking? Authorizing Provider  Aspirin-Salicylamide-Caffeine (BC HEADACHE POWDER PO) Take 1 Package by mouth daily as needed (pain).    [provider]  Cyanocobalamin (VITAMIN B-12 PO) Take 1 tablet by mouth daily.    [provider]  methocarbamol (ROBAXIN) 500 MG tablet Take 2 tablets (1,000 mg total) by mouth 4 (four) times daily as needed for muscle spasms (muscle spasm/pain). Patient not taking: Reported on 06/24/2017 06/23/17   Fayrene Helperran, Bowie, PA-C  ranitidine  (ZANTAC) 150 MG tablet Take 1 tablet (150 mg total) by mouth 2 (two) times daily. For stomach irritation. Patient not taking: Reported on 06/24/2017 03/23/15   Ivery QualeBryant, [place], PA-C    Allergies  Allergen Reactions  . Eggs Or Egg-Derived Products Anaphylaxis  . Penicillins Anaphylaxis    Has patient had a PCN reaction causing immediate rash, facial/tongue/throat swelling, SOB or lightheadedness with hypotension: No Has patient had a PCN reaction causing severe rash involving mucus membranes or skin necrosis: No Has patient had a PCN reaction that required hospitalization No Has patient had a PCN reaction occurring within the last 10 years: No If all of the above answers are "NO", then may proceed with Cephalosporin use.   . Bee Venom   . Iodine Rash  . Latex Rash    Family History  Problem Relation Age of Onset  . Hypertension Father     Social History Social History   Tobacco Use  . Smoking status: Current Every Day Smoker    Packs/day: 1.00    Types: Cigarettes  . Smokeless tobacco: Never Used  Substance Use Topics  . Alcohol use: Yes    Comment: occasional  . Drug use: No    Review of Systems Constitutional: Negative for fever. ENT: Negative for sensation of swelling. Cardiovascular: Negative for chest pain. Respiratory: Negative for shortness of breath. Gastrointestinal: Negative for abdominal pain, vomiting  Genitourinary: Negative for urinary compaints Musculoskeletal: Left arm swelling and discomfort.  Skin: Mild redness to the left arm Neurological: Negative for headache All other ROS negative  ____________________________________________   PHYSICAL EXAM:  VITAL SIGNS: ED Triage Vitals  Enc Vitals Group     BP 08/22/17 2145 134/90     Pulse Rate 08/22/17 2145 (!) 102     Resp 08/22/17 2145 15     Temp 08/22/17 2145 98.4 F (36.9 C)     Temp Source 08/22/17 2145 Oral     SpO2 08/22/17 2145 100 %     Weight 08/22/17 2146 142 lb (64.4 kg)     Height  08/22/17 2146 5\' 3"  (1.6 m)     Head Circumference --      Peak Flow --      Pain Score 08/22/17 2145 6     Pain Loc --      Pain Edu? --      Excl. in GC? --     Constitutional: Alert and oriented. Well appearing and in no distress. Eyes: Normal exam ENT   Head: Normocephalic and atraumatic.   Mouth/Throat: Mucous membranes are moist. Cardiovascular: Normal rate, regular rhythm. No murmur Respiratory: Normal respiratory effort without tachypnea nor retractions. Breath sounds are clear, without wheeze. Gastrointestinal: Soft and nontender. No distention.  Musculoskeletal: Patient has mild to moderate edema of the left hand and halfway up the left forearm with mild erythema to the lateral left forearm most consistent with mild lymphatic streaking.. Neurologic:  Normal speech and language. No gross focal neurologic deficits  Skin:  Skin is warm, mild erythema as noted above. Psychiatric: Mood and affect are normal.   ____________________________________________   INITIAL IMPRESSION / ASSESSMENT AND PLAN / ED COURSE  Pertinent labs & imaging results that were available during my care of the patient were reviewed by me and considered in my medical decision making (see chart for details).  Patient presents to the emergency department after being stung by a wasp last night.  Patient states allergy to wasp stings.  Patient saw the wasp in her.  Patient's left hand is moderately swollen with swelling extending a little over halfway up the forearm with mild erythema to the medial aspect of the forearm most consistent with lymphatic streaking.  Patient states a history of allergic reaction to insect stings in the past.  Will cover with prednisone continue to recommend 50 mg of Benadryl every 6 hours if needed.  Given the mild lymphatic streaking and the edema I believe it is reasonable to cover with antibiotics as a precaution as well.  Patient states her only drug allergy is penicillin.   We will cover with doxycycline as a precaution have the patient take steroids for 5 more days and Benadryl as needed.  Patient denies possibility of pregnancy, currently on her period.  I discussed very strict return precautions with the patient.  ____________________________________________   FINAL CLINICAL IMPRESSION(S) / ED DIAGNOSES  Allergic reaction    Minna AntisPaduchowski, Eliora Nienhuis, MD 08/22/17 2226    Minna AntisPaduchowski, Vonna Brabson, MD 08/22/17 2227

## 2017-08-22 NOTE — ED Triage Notes (Signed)
Patient reports being stung by a wasp last night on the left hand. Swelling noted to right hand. Patient's respirations even and unlabored. No oral swelling seen or complained off. Patient has been taking benadryl since sting. Patient reports last dose, 2 tablets and 5:30. Patient reports reaction is slowly getting worse. Redness seen extending up right arm.

## 2017-12-19 ENCOUNTER — Other Ambulatory Visit: Payer: Self-pay

## 2017-12-19 ENCOUNTER — Ambulatory Visit
Admission: EM | Admit: 2017-12-19 | Discharge: 2017-12-19 | Disposition: A | Discharge status: Home / Self Care | Payer: Self-pay | Attending: Family Medicine | Admitting: Family Medicine

## 2017-12-19 DIAGNOSIS — S61411A Laceration without foreign body of right hand, initial encounter: Secondary | ICD-10-CM

## 2017-12-19 DIAGNOSIS — W268XXA Contact with other sharp object(s), not elsewhere classified, initial encounter: Secondary | ICD-10-CM

## 2017-12-19 MED ORDER — MUPIROCIN 2 % EX OINT: 1.0000 "application " | TOPICAL_OINTMENT | Freq: Three times a day (TID) | CUTANEOUS | 0 refills | Status: DC

## 2017-12-19 NOTE — ED Triage Notes (Signed)
Patient complains of hand laceration that occurred while trying to get open a corned beef can. Patient states that she has a laceration to her right hand that has continued to bleed.

## 2017-12-19 NOTE — ED Provider Notes (Signed)
[facility]    CSN: [id number] Arrival date & time: 12/19/17  1814     History   Chief Complaint Chief Complaint  Patient presents with  . Extremity Laceration    right    HPI Isabel Robertson is a 32 y.o. female.   HPI  -year-old female cut her's hand ulnar lateral surface extending longitudinally 2.4 cm.  Current on her tetanus toxoid.  Cut her hand while opening a can of corn beef.        Past Medical History:  Diagnosis Date  . Anxiety   . Asthma   . Bipolar 1 disorder (HCC)   . Seizures [facility])     Patient Active Problem List   Diagnosis Date Noted  . Nondisplaced fracture of fifth metatarsal bone, left foot, subsequent encounter for fracture with routine healing 06/23/17 06/28/2017  . Vaginal bleeding 08/17/2014    Past Surgical History:  Procedure Laterality Date  . TUBAL LIGATION  11/2010    OB History   No obstetric history on file.      Home Medications    Prior to Admission medications   Medication Sig Start Date End Date Taking? Authorizing Provider  mupirocin ointment (BACTROBAN) 2 % Apply 1 application topically 3 (three) times daily. 12/19/17   Lutricia Feil, PA-C    Family History Family History  Problem Relation Age of Onset  . Hypertension Father     Social History Social History   Tobacco Use  . Smoking status: Current Every Day Smoker    Packs/day: 1.00    Types: Cigarettes  . Smokeless tobacco: Never Used  Substance Use Topics  . Alcohol use: Yes    Comment: occasional  . Drug use: No     Allergies   Eggs or egg-derived products; Penicillins; Bee venom; Iodine; and Latex   Review of Systems Review of Systems  Constitutional: Positive for activity change. Negative for appetite change, chills, fatigue and fever.  Skin: Positive for wound.  All other systems reviewed and are negative.    Physical Exam Triage Vital Signs ED Triage Vitals  Enc Vitals Group     BP 12/19/17 1842 123/87     Pulse  Rate 12/19/17 1842 79     Resp 12/19/17 1842 18     Temp 12/19/17 1842 98.1 F (36.7 C)     Temp Source 12/19/17 1842 Oral     SpO2 12/19/17 1842 100 %     Weight 12/19/17 1841 150 lb (68 kg)     Height 12/19/17 1841 5\' 3"  (1.6 m)     Head Circumference --      Peak Flow --      Pain Score 12/19/17 1841 0     Pain Loc --      Pain Edu? --      Excl. in GC? --    No data found.  Updated Vital Signs BP 123/87 (BP Location: Left Arm)   Pulse 79   Temp 98.1 F (36.7 C) (Oral)   Resp 18   Ht 5\' 3"  (1.6 m)   Wt 150 lb (68 kg)   LMP 12/12/2017   SpO2 100%   BMI 26.57 kg/m   Visual Acuity Right Eye Distance:   Left Eye Distance:   Bilateral Distance:    Right Eye Near:   Left Eye Near:    Bilateral Near:     Physical Exam Vitals signs and nursing note reviewed.  Constitutional:  General: She is not in acute distress.    Appearance: Normal appearance. She is normal weight. She is not ill-appearing, toxic-appearing or diaphoretic.  HENT:     Head: Normocephalic.     Mouth/Throat:     Mouth: Mucous membranes are moist.     Pharynx: Oropharynx is clear.  Eyes:     Pupils: Pupils are equal, round, and reactive to light.  Neck:     Musculoskeletal: Normal range of motion.  Musculoskeletal: Normal range of motion.  Skin:    General: Skin is warm and dry.     Comments: 2.4 cm log maturely oriented laceration over the ulnar aspect of the hand overlying the metacarpal.  Patient has normal sensation distal to the cut.  FDS FDP to the little finger is intact extensor tendon is intact to clinical testing.  He is able to resist.  Neurological:     General: No focal deficit present.     Mental Status: She is alert and oriented to person, place, and time.  Psychiatric:        Mood and Affect: Mood normal.        Behavior: Behavior normal.        Thought Content: Thought content normal.        Judgment: Judgment normal.      UC Treatments / Results  Labs (all labs  ordered are listed, but only abnormal results are displayed) Labs Reviewed - No data to display  EKG None  Radiology No results found.  Procedures Laceration Repair Date/Time: 12/19/2017 7:59 PM Performed by: Lutricia Feiloemer, William P, PA-C Authorized by: Tommie Samsook, Jayce G, DO   Consent:    Consent obtained:  Verbal   Consent given by:  Patient   Risks discussed:  Infection and pain Anesthesia (see MAR for exact dosages):    Anesthesia method:  Local infiltration   Local anesthetic:  Lidocaine 1% w/o epi Laceration details:    Location:  Hand   Hand location:  R hand, dorsum   Length (cm):  2.4   Depth (mm):  3 Repair type:    Repair type:  Simple Pre-procedure details:    Preparation:  Patient was prepped and draped in usual sterile fashion Exploration:    Hemostasis achieved with:  Direct pressure   Wound exploration: entire depth of wound probed and visualized     Contaminated: no   Treatment:    Area cleansed with:  Saline   Amount of cleaning:  Standard   Irrigation solution:  Sterile saline   Irrigation volume:  60   Irrigation method:  Pressure wash   Visualized foreign bodies/material removed: no   Skin repair:    Repair method:  Sutures   Suture size:  5-0   Suture material:  Nylon   Number of sutures:  5 Approximation:    Approximation:  Close Post-procedure details:    Dressing:  Antibiotic ointment, non-adherent dressing and sterile dressing   Patient tolerance of procedure:  Tolerated well, no immediate complications Comments:     Dry for 24 hours. Afterwards wash daily 3 times and apply mupirocin ointment to the wound until the sutures are removed.  Watch out for signs and symptoms of infection that we discussed.  Plan suture removal in 10 days.   (including critical care time)  Medications Ordered in UC Medications - No data to display  Initial Impression / Assessment and Plan / UC Course  I have reviewed the triage vital signs and the nursing  notes.  Pertinent labs & imaging results that were available during my care of the patient were reviewed by me and considered in my medical decision making (see chart for details).   Keep dry for 24 hours. Afterwards wash daily 3 times and apply mupirocin ointment to the wound until the sutures are removed.  Watch out for signs and symptoms of infection that we discussed.  Plan suture removal in 10 days.    Final Clinical Impressions(s) / UC Diagnoses   Final diagnoses:  Laceration of right hand without foreign body, initial encounter     Discharge Instructions     Keep dry for 24 hours.  Apply mupirocin ointment to the wound 3 times daily until sutures are removed in 10 days.  If any signs or symptoms of infection occur as we discussed return to our clinic immediately or go to the emergency room   ED Prescriptions    Medication Sig Dispense Auth. Provider   mupirocin ointment (BACTROBAN) 2 % Apply 1 application topically 3 (three) times daily. 22 g Lutricia Feil, PA-C     Controlled Substance Prescriptions [place] Controlled Substance Registry consulted? Not Applicable   Lutricia Feil, PA-C 12/19/17 2003

## 2017-12-19 NOTE — Discharge Instructions (Signed)
Keep dry for 24 hours.  Apply mupirocin ointment to the wound 3 times daily until sutures are removed in 10 days.  If any signs or symptoms of infection occur as we discussed return to our clinic immediately or go to the emergency room

## 2019-05-08 ENCOUNTER — Emergency Department
Admission: EM | Admit: 2019-05-08 | Discharge: 2019-05-08 | Disposition: A | Discharge status: Home / Self Care | Payer: Medicaid Other | Attending: Emergency Medicine | Admitting: Emergency Medicine

## 2019-05-08 ENCOUNTER — Encounter: Payer: Self-pay | Admitting: Emergency Medicine

## 2019-05-08 ENCOUNTER — Other Ambulatory Visit: Payer: Self-pay

## 2019-05-08 ENCOUNTER — Emergency Department: Payer: Medicaid Other

## 2019-05-08 DIAGNOSIS — J069 Acute upper respiratory infection, unspecified: Secondary | ICD-10-CM | POA: Insufficient documentation

## 2019-05-08 DIAGNOSIS — Z20822 Contact with and (suspected) exposure to covid-19: Secondary | ICD-10-CM | POA: Insufficient documentation

## 2019-05-08 DIAGNOSIS — J45909 Unspecified asthma, uncomplicated: Secondary | ICD-10-CM | POA: Insufficient documentation

## 2019-05-08 LAB — BASIC METABOLIC PANEL
Anion gap: 9 (ref 5–15)
BUN: 17 mg/dL (ref 6–20)
CO2: 26 mmol/L (ref 22–32)
Calcium: 8.8 mg/dL — ABNORMAL LOW (ref 8.9–10.3)
Chloride: 105 mmol/L (ref 98–111)
Creatinine, Ser: 0.6 mg/dL (ref 0.44–1.00)
GFR calc Af Amer: >60 mL/min (ref >60)
GFR calc non Af Amer: >60 mL/min (ref >60)
Glucose, Bld: 105 mg/dL — ABNORMAL HIGH (ref 70–99)
Potassium: 4.5 mmol/L (ref 3.5–5.1)
Sodium: 140 mmol/L (ref 135–145)

## 2019-05-08 LAB — TROPONIN I (HIGH SENSITIVITY): Troponin I (High Sensitivity): <2 ng/L (ref <18)

## 2019-05-08 LAB — CBC
HCT: 42.5 % (ref 36.0–46.0)
Hemoglobin: 14.2 g/dL (ref 12.0–15.0)
MCH: 32.2 pg (ref 26.0–34.0)
MCHC: 33.4 g/dL (ref 30.0–36.0)
MCV: 96.4 fL (ref 80.0–100.0)
Platelets: 287 10*3/uL (ref 150–400)
RBC: 4.41 MIL/uL (ref 3.87–5.11)
RDW: 12.5 % (ref 11.5–15.5)
WBC: 13.3 10*3/uL — ABNORMAL HIGH (ref 4.0–10.5)
nRBC: 0 % (ref 0.0–0.2)

## 2019-05-08 LAB — RESPIRATORY PANEL BY RT PCR (FLU A&B, COVID)
Influenza A by PCR: NEGATIVE
Influenza B by PCR: NEGATIVE
SARS Coronavirus 2 by RT PCR: NEGATIVE

## 2019-05-08 MED ORDER — PREDNISONE 20 MG PO TABS: 40.0000 mg | ORAL_TABLET | Freq: Every day | ORAL | 0 refills | Status: DC

## 2019-05-08 NOTE — ED Provider Notes (Signed)
[facility] Emergency Department Provider Note  Time seen: 3:43 PM  I have reviewed the triage vital signs and the nursing notes.   HISTORY  Chief Complaint Shortness of Breath   HPI Isabel Robertson is a 34 y.o. female with a past medical history anxiety, asthma, bipolar, seizures, presents to the emergency department for cough congestion chest pressure.  According to the patient over the past week to week and a half she has been feeling chest pressure congestion, mild shortness of breath.  Also states occasional cough.  Patient's thought it was her allergies has been taking allergy medication without relief so she came to the emergency department.  Here the patient appears well, no chest pain, states she has congestion mostly in her throat and nose.  Patient's vitals are reassuring.  Patient denies any fever at any point.  Has not received Covid vaccinations nor has she had Covid in the past.   Past Medical History:  Diagnosis Date  . Anxiety   . Asthma   . Bipolar 1 disorder ([place])   . Seizures [facility])     Patient Active Problem List   Diagnosis Date Noted  . Nondisplaced fracture of fifth metatarsal bone, left foot, subsequent encounter for fracture with routine healing 06/23/17 06/28/2017  . Vaginal bleeding 08/17/2014    Past Surgical History:  Procedure Laterality Date  . TUBAL LIGATION  11/2010    Prior to Admission medications   Medication Sig Start Date End Date Taking? Authorizing Provider  mupirocin ointment (BACTROBAN) 2 % Apply 1 application topically 3 (three) times daily. 12/19/17   Lorin Picket, PA-C    Allergies  Allergen Reactions  . Eggs Or Egg-Derived Products Anaphylaxis  . Penicillins Anaphylaxis    Has patient had a PCN reaction causing immediate rash, facial/tongue/throat swelling, SOB or lightheadedness with hypotension: No Has patient had a PCN reaction causing severe rash involving mucus membranes or skin necrosis: No Has  patient had a PCN reaction that required hospitalization No Has patient had a PCN reaction occurring within the last 10 years: No If all of the above answers are "NO", then may proceed with Cephalosporin use.   . Bee Venom   . Iodine Rash  . Latex Rash    Family History  Problem Relation Age of Onset  . Hypertension Father     Social History Social History   Tobacco Use  . Smoking status: Current Every Day Smoker    Packs/day: 1.00    Types: Cigarettes  . Smokeless tobacco: Never Used  Substance Use Topics  . Alcohol use: Yes    Comment: occasional  . Drug use: No    Review of Systems Constitutional: Negative for fever. ENT mild congestion. Cardiovascular: Negative for chest pain. Respiratory: Mild shortness of breath.  Occasional cough. Gastrointestinal: Negative for abdominal pain Musculoskeletal: Negative for musculoskeletal complaints Neurological: Negative for headache All other ROS negative  ____________________________________________   PHYSICAL EXAM:  VITAL SIGNS: ED Triage Vitals  Enc Vitals Group     BP 05/08/19 1357 (!) 147/93     Pulse Rate 05/08/19 1357 85     Resp 05/08/19 1357 19     Temp 05/08/19 1357 98.5 F (36.9 C)     Temp Source 05/08/19 1357 Oral     SpO2 05/08/19 1357 100 %     Weight 05/08/19 1358 154 lb (69.9 kg)     Height 05/08/19 1358 5\' 4"  (1.626 m)     Head Circumference --  Peak Flow --      Pain Score 05/08/19 1400 8     Pain Loc --      Pain Edu? --      Excl. in [place]? --    Constitutional: Alert and oriented. Well appearing and in no distress. Eyes: Normal exam ENT      Head: Normocephalic and atraumatic.      Mouth/Throat: Mucous membranes are moist. Cardiovascular: Normal rate, regular rhythm. Respiratory: Normal respiratory effort without tachypnea nor retractions. Breath sounds are clear  Gastrointestinal: Soft and nontender. No distention.   Musculoskeletal: Nontender with normal range of motion in all  extremities.  Neurologic:  Normal speech and language. No gross focal neurologic deficits  Skin:  Skin is warm, dry and intact.  Psychiatric: Mood and affect are normal.   ____________________________________________    EKG  EKG viewed and interpreted by myself shows a normal sinus rhythm at 93 bpm with a narrow QRS, normal axis, normal intervals, no concerning ST changes.  ____________________________________________    RADIOLOGY   IMPRESSION:  No acute process in the chest.   ____________________________________________   INITIAL IMPRESSION / ASSESSMENT AND PLAN / ED COURSE  Pertinent labs & imaging results that were available during my care of the patient were reviewed by me and considered in my medical decision making (see chart for details).   Patient presents to the emergency department for upper respiratory symptoms mild cough, shortness of breath congestion, chest pressure.  Overall the patient appears well with a reassuring physical exam, clear lung sounds.  No concerning findings.  Reassuring vitals.  Reassuring lab work including negative troponin.  Clear chest x-ray and reassuring EKG.  Patient possibly suffering from allergies, less likely corona.  We will swab as a precaution.  We will discharge the patient on a short course of prednisone recommended over-the-counter allergy medications.  Patient agreeable to plan of care.  Isabel Robertson was evaluated in Emergency Department on 05/08/2019 for the symptoms described in the history of present illness. She was evaluated in the context of the global COVID-19 pandemic, which necessitated consideration that the patient might be at risk for infection with the SARS-CoV-2 virus that causes COVID-19. Institutional protocols and algorithms that pertain to the evaluation of patients at risk for COVID-19 are in a state of rapid change based on information released by regulatory bodies including the CDC and federal and state organizations.  These policies and algorithms were followed during the patient's care in the ED.  ____________________________________________   FINAL CLINICAL IMPRESSION(S) / ED DIAGNOSES  Upper respiratory infection   [name], MD 05/08/19 1547

## 2019-05-08 NOTE — ED Triage Notes (Signed)
Pt presents to ED via POV c/o SOB and chest pressure x1.5wks. States she thought it was allergies but is not improving with allergy meds.

## 2020-11-27 ENCOUNTER — Other Ambulatory Visit: Payer: Self-pay

## 2020-11-27 ENCOUNTER — Emergency Department
Admission: EM | Admit: 2020-11-27 | Discharge: 2020-11-27 | Disposition: A | Discharge status: Home / Self Care | Payer: Medicaid Other | Attending: Emergency Medicine | Admitting: Emergency Medicine

## 2020-11-27 DIAGNOSIS — J45909 Unspecified asthma, uncomplicated: Secondary | ICD-10-CM | POA: Insufficient documentation

## 2020-11-27 DIAGNOSIS — Z91040 Latex allergy status: Secondary | ICD-10-CM | POA: Insufficient documentation

## 2020-11-27 DIAGNOSIS — R112 Nausea with vomiting, unspecified: Secondary | ICD-10-CM | POA: Insufficient documentation

## 2020-11-27 DIAGNOSIS — N12 Tubulo-interstitial nephritis, not specified as acute or chronic: Secondary | ICD-10-CM | POA: Insufficient documentation

## 2020-11-27 DIAGNOSIS — F17210 Nicotine dependence, cigarettes, uncomplicated: Secondary | ICD-10-CM | POA: Insufficient documentation

## 2020-11-27 LAB — COMPREHENSIVE METABOLIC PANEL
ALT: 17 U/L (ref 0–44)
AST: 16 U/L (ref 15–41)
Albumin: 3.4 g/dL — ABNORMAL LOW (ref 3.5–5.0)
Alkaline Phosphatase: 39 U/L (ref 38–126)
Anion gap: 7 (ref 5–15)
BUN: 19 mg/dL (ref 6–20)
CO2: 26 mmol/L (ref 22–32)
Calcium: 9.3 mg/dL (ref 8.9–10.3)
Chloride: 102 mmol/L (ref 98–111)
Creatinine, Ser: 0.72 mg/dL (ref 0.44–1.00)
GFR, Estimated: >60 mL/min (ref >60)
Glucose, Bld: 103 mg/dL — ABNORMAL HIGH (ref 70–99)
Potassium: 4.3 mmol/L (ref 3.5–5.1)
Sodium: 135 mmol/L (ref 135–145)
Total Bilirubin: 0.2 mg/dL — ABNORMAL LOW (ref 0.3–1.2)
Total Protein: 7 g/dL (ref 6.5–8.1)

## 2020-11-27 LAB — PREGNANCY, URINE: Preg Test, Ur: NEGATIVE

## 2020-11-27 LAB — CBC
HCT: 45 % (ref 36.0–46.0)
Hemoglobin: 15.1 g/dL — ABNORMAL HIGH (ref 12.0–15.0)
MCH: 32.6 pg (ref 26.0–34.0)
MCHC: 33.6 g/dL (ref 30.0–36.0)
MCV: 97.2 fL (ref 80.0–100.0)
Platelets: 248 10*3/uL (ref 150–400)
RBC: 4.63 MIL/uL (ref 3.87–5.11)
RDW: 12.5 % (ref 11.5–15.5)
WBC: 9.5 10*3/uL (ref 4.0–10.5)
nRBC: 0 % (ref 0.0–0.2)

## 2020-11-27 LAB — URINALYSIS, ROUTINE W REFLEX MICROSCOPIC
Bilirubin Urine: NEGATIVE
Glucose, UA: NEGATIVE mg/dL
Ketones, ur: NEGATIVE mg/dL
Nitrite: POSITIVE — AB
Protein, ur: NEGATIVE mg/dL
Specific Gravity, Urine: 1.016 (ref 1.005–1.030)
pH: 5 (ref 5.0–8.0)

## 2020-11-27 LAB — LIPASE, BLOOD: Lipase: 44 U/L (ref 11–51)

## 2020-11-27 MED ORDER — ONDANSETRON 4 MG PO TBDP: 4.0000 mg | ORAL_TABLET | Freq: Three times a day (TID) | ORAL | 0 refills | Status: DC | PRN

## 2020-11-27 MED ORDER — SODIUM CHLORIDE 0.9 % IV SOLN
1.0000 g | Freq: Once | INTRAVENOUS | Status: AC
  Administered 2020-11-27: 1 g via INTRAVENOUS
  Filled 2020-11-27: qty 10

## 2020-11-27 MED ORDER — CEFDINIR 300 MG PO CAPS: 300.0000 mg | ORAL_CAPSULE | Freq: Two times a day (BID) | ORAL | 0 refills | Status: AC

## 2020-11-27 MED ORDER — KETOROLAC TROMETHAMINE 30 MG/ML IJ SOLN
15.0000 mg | Freq: Once | INTRAMUSCULAR | Status: AC
  Administered 2020-11-27: 15 mg via INTRAVENOUS
  Filled 2020-11-27: qty 1

## 2020-11-27 MED ORDER — SODIUM CHLORIDE 0.9 % IV BOLUS
1000.0000 mL | Freq: Once | INTRAVENOUS | Status: AC
  Administered 2020-11-27: 1000 mL via INTRAVENOUS

## 2020-11-27 NOTE — ED Provider Notes (Signed)
[facility] Emergency Department Provider Note   ____________________________________________   Event Date/Time   First MD Initiated Contact with Patient 11/27/20 1741     (approximate)  I have reviewed the triage vital signs and the nursing notes.   HISTORY  Chief Complaint Abdominal Pain    HPI Isabel Robertson is a 35 y.o. female with past medical history of bipolar disorder, asthma, and seizures who presents to the ED complaining of flank pain.  Patient reports that she had been dealing with intermittent sharp pain in her right flank for the past 3 days, however the pain has become more severe and constant over the past 24 hours.  Pain is not exacerbated or alleviated by anything in particular.  She has felt nauseous at times and reports vomiting last night, although nausea has improved today.  She has not had any diarrhea, denies any dysuria or hematuria.  Pain seems to extend into the right lower quadrant of her abdomen but is more severe in her flank.  She has never had similar symptoms in the past, denies any history of kidney stones.  She reports subjective fevers earlier today but has not taken her temperature, took ibuprofen around 10 AM.        Past Medical History:  Diagnosis Date   Anxiety    Asthma    Bipolar 1 disorder ([facility])    Seizures ([facility])     Patient Active Problem List   Diagnosis Date Noted   Nondisplaced fracture of fifth metatarsal bone, left foot, subsequent encounter for fracture with routine healing 06/23/17 06/28/2017   Vaginal bleeding 08/17/2014    Past Surgical History:  Procedure Laterality Date   TUBAL LIGATION  11/2010    Prior to Admission medications   Medication Sig Start Date End Date Taking? Authorizing Provider  cefdinir (OMNICEF) 300 MG capsule Take 1 capsule (300 mg total) by mouth 2 (two) times daily for 10 days. 11/27/20 12/07/20 Yes Chesley Noon, MD  ondansetron (ZOFRAN ODT) 4 MG disintegrating  tablet Take 1 tablet (4 mg total) by mouth every 8 (eight) hours as needed for nausea or vomiting. 11/27/20  Yes Chesley Noon, MD  mupirocin ointment (BACTROBAN) 2 % Apply 1 application topically 3 (three) times daily. 12/19/17   Lutricia Feil, PA-C  predniSONE (DELTASONE) 20 MG tablet Take 2 tablets (40 mg total) by mouth daily. 05/08/19   [name], MD    Allergies Eggs or egg-derived products, Penicillins, Bee venom, Iodine, and Latex  Family History  Problem Relation Age of Onset   Hypertension Father     Social History Social History   Tobacco Use   Smoking status: Every Day    Packs/day: 1.00    Types: Cigarettes   Smokeless tobacco: Never  Vaping Use   Vaping Use: Never used  Substance Use Topics   Alcohol use: Yes    Comment: occasional   Drug use: No    Review of Systems  Constitutional: Positive for subjective fever/chills Eyes: No visual changes. ENT: No sore throat. Cardiovascular: Denies chest pain. Respiratory: Denies shortness of breath. Gastrointestinal: Positive for flank and abdominal pain.  Positive for nausea and vomiting.  No diarrhea.  No constipation. Genitourinary: Negative for dysuria. Musculoskeletal: Negative for back pain. Skin: Negative for rash. Neurological: Negative for headaches, focal weakness or numbness.  ____________________________________________   PHYSICAL EXAM:  VITAL SIGNS: ED Triage Vitals  Enc Vitals Group     BP 11/27/20 1334 (!) 136/94  Pulse Rate 11/27/20 1334 89     Resp 11/27/20 1334 19     Temp 11/27/20 1334 98 F (36.7 C)     Temp Source 11/27/20 1337 Oral     SpO2 11/27/20 1334 96 %     Weight --      Height --      Head Circumference --      Peak Flow --      Pain Score 11/27/20 1334 5     Pain Loc --      Pain Edu? --      Excl. in [place]? --     Constitutional: Alert and oriented. Eyes: Conjunctivae are normal. Head: Atraumatic. Nose: No congestion/rhinnorhea. Mouth/Throat:  Mucous membranes are moist. Neck: Normal ROM Cardiovascular: Normal rate, regular rhythm. Grossly normal heart sounds.  2+ radial pulses bilaterally. Respiratory: Normal respiratory effort.  No retractions. Lungs CTAB. Gastrointestinal: Soft and nontender.  Right CVA tenderness noted.  No distention. Genitourinary: deferred Musculoskeletal: No lower extremity tenderness nor edema. Neurologic:  Normal speech and language. No gross focal neurologic deficits are appreciated. Skin:  Skin is warm, dry and intact. No rash noted. Psychiatric: Mood and affect are normal. Speech and behavior are normal.  ____________________________________________   LABS (all labs ordered are listed, but only abnormal results are displayed)  Labs Reviewed  COMPREHENSIVE METABOLIC PANEL - Abnormal; Notable for the following components:      Result Value   Glucose, Bld 103 (*)    Albumin 3.4 (*)    Total Bilirubin 0.2 (*)    All other components within normal limits  CBC - Abnormal; Notable for the following components:   Hemoglobin 15.1 (*)    All other components within normal limits  URINALYSIS, ROUTINE W REFLEX MICROSCOPIC - Abnormal; Notable for the following components:   Color, Urine AMBER (*)    APPearance CLEAR (*)    Hgb urine dipstick MODERATE (*)    Nitrite POSITIVE (*)    Leukocytes,Ua TRACE (*)    Bacteria, UA MANY (*)    All other components within normal limits  URINE CULTURE  LIPASE, BLOOD  PREGNANCY, URINE    PROCEDURES  Procedure(s) performed (including Critical Care):  Procedures   ____________________________________________   INITIAL IMPRESSION / ASSESSMENT AND PLAN / ED COURSE      35 year old female with past medical history of bipolar disorder, asthma, and seizures who presents to the ED with 3 days of worsening pain in her right flank that radiates towards the right lower quadrant of her abdomen.  She denies any urinary symptoms but UA appears grossly infected.   With her CVA tenderness on the right I suspect her symptoms are due to a pyelonephritis.  We will send urine for culture and treat with Rocephin as patient reports hives to penicillins in the past but has not had any history of anaphylaxis.  Labs are otherwise unremarkable.  Patient with no evidence of allergic reaction to reaction, pain is improved following dose of Toradol.  She is appropriate for discharge home with PCP follow-up, will be prescribed a course of cefdinir and was counseled to return to the ED for new worsening symptoms.  Patient agrees with plan.      ____________________________________________   FINAL CLINICAL IMPRESSION(S) / ED DIAGNOSES  Final diagnoses:  Pyelonephritis     ED Discharge Orders          Ordered    cefdinir (OMNICEF) 300 MG capsule  2 times daily  11/27/20 1859    ondansetron (ZOFRAN ODT) 4 MG disintegrating tablet  Every 8 hours PRN        11/27/20 1859             Note:  This document was prepared using Dragon voice recognition software and may include unintentional dictation errors.    Blake Divine, MD 11/27/20 1902

## 2020-11-27 NOTE — ED Triage Notes (Signed)
Pt come with c/o possible UTI. Pt states now her kidneys hurts and feel swollen. Pt states this started Thursday night. Pt states fever today.

## 2020-11-27 NOTE — ED Notes (Signed)
E-signature pad unavailable - Pt verbalized understanding of D/C information - no additional concerns at this time.  

## 2020-11-29 LAB — URINE CULTURE: Culture: >=100000 — AB

## 2022-02-15 ENCOUNTER — Emergency Department: Payer: Medicaid Other

## 2022-02-15 ENCOUNTER — Other Ambulatory Visit: Payer: Self-pay

## 2022-02-15 ENCOUNTER — Emergency Department
Admission: EM | Admit: 2022-02-15 | Discharge: 2022-02-15 | Disposition: A | Discharge status: Home / Self Care | Payer: Medicaid Other | Attending: Emergency Medicine | Admitting: Emergency Medicine

## 2022-02-15 DIAGNOSIS — I2510 Atherosclerotic heart disease of native coronary artery without angina pectoris: Secondary | ICD-10-CM | POA: Insufficient documentation

## 2022-02-15 DIAGNOSIS — R609 Edema, unspecified: Secondary | ICD-10-CM

## 2022-02-15 DIAGNOSIS — J45909 Unspecified asthma, uncomplicated: Secondary | ICD-10-CM | POA: Insufficient documentation

## 2022-02-15 DIAGNOSIS — M7989 Other specified soft tissue disorders: Secondary | ICD-10-CM | POA: Diagnosis not present

## 2022-02-15 DIAGNOSIS — R519 Headache, unspecified: Secondary | ICD-10-CM | POA: Diagnosis present

## 2022-02-15 DIAGNOSIS — I10 Essential (primary) hypertension: Secondary | ICD-10-CM | POA: Diagnosis not present

## 2022-02-15 LAB — CBC
HCT: 43.6 % (ref 36.0–46.0)
Hemoglobin: 14.4 g/dL (ref 12.0–15.0)
MCH: 31.8 pg (ref 26.0–34.0)
MCHC: 33 g/dL (ref 30.0–36.0)
MCV: 96.2 fL (ref 80.0–100.0)
Platelets: 290 10*3/uL (ref 150–400)
RBC: 4.53 MIL/uL (ref 3.87–5.11)
RDW: 12.7 % (ref 11.5–15.5)
WBC: 10.5 10*3/uL (ref 4.0–10.5)
nRBC: 0 % (ref 0.0–0.2)

## 2022-02-15 LAB — BASIC METABOLIC PANEL
Anion gap: 7 (ref 5–15)
BUN: 13 mg/dL (ref 6–20)
CO2: 27 mmol/L (ref 22–32)
Calcium: 8.9 mg/dL (ref 8.9–10.3)
Chloride: 104 mmol/L (ref 98–111)
Creatinine, Ser: 0.72 mg/dL (ref 0.44–1.00)
GFR, Estimated: >60 mL/min (ref >60)
Glucose, Bld: 116 mg/dL — ABNORMAL HIGH (ref 70–99)
Potassium: 4.2 mmol/L (ref 3.5–5.1)
Sodium: 138 mmol/L (ref 135–145)

## 2022-02-15 LAB — TROPONIN I (HIGH SENSITIVITY)
Troponin I (High Sensitivity): <2 ng/L (ref <18)
Troponin I (High Sensitivity): <2 ng/L (ref <18)

## 2022-02-15 LAB — POC URINE PREG, ED: Preg Test, Ur: NEGATIVE

## 2022-02-15 MED ORDER — ACETAMINOPHEN 500 MG PO TABS
1000.0000 mg | ORAL_TABLET | Freq: Once | ORAL | Status: AC
  Administered 2022-02-15: 1000 mg via ORAL
  Filled 2022-02-15: qty 2

## 2022-02-15 MED ORDER — HYDROCHLOROTHIAZIDE 12.5 MG PO TABS: 12.5000 mg | ORAL_TABLET | Freq: Every day | ORAL | 2 refills | Status: AC

## 2022-02-15 NOTE — Discharge Instructions (Signed)
Thank you for choosing us for your health care today!  Please see your primary doctor this week for a follow up appointment.   Sometimes, in the early stages of certain disease courses it is difficult to detect in the emergency department evaluation -- so, it is important that you continue to monitor your symptoms and call your doctor right away or return to the emergency department if you develop any new or worsening symptoms.  Please go to the following website to schedule new (and existing) patient appointments:   [email or web address]  If you do not have a primary doctor try calling the following clinics to establish care:  If you have insurance:  [facility] [phone] [address] [place] [address]   [facility]  [phone] [address] [place] [address] [place] [address]   If you do not have insurance:  [facility]  [phone] [address] [place] [address]   The following is another list of primary care offices in the area who are accepting new patients at this time.  Please reach out to one of them directly and let them know you would like to schedule an appointment to follow up on an Emergency Department visit, and/or to establish a new primary care provider (PCP).  There are likely other primary care clinics in the are who are accepting new patients, but this is an excellent place to start:  [facility] Lead physician: Dr Angela Bacigalupo [address] [place], [place] [address] [phone]  [facility] Lead Physician: Dr Krichna Sowles [address], [place], [place] [address] [phone]  [facility]  Lead Physician: Dr Megan Johnson [address], [place], [place] [address] [phone]  [facility] [place] [facility] Lead Physician: Dr Alex Karamalegos [address], [place], [place] [address] [phone]  [facility] [place] Lead Physician: Dr Laura Berglund [address], [place], [place] [address] [phone]   It was my pleasure to care for you today.   Nilo Fallin S. Meshell Abdulaziz, MD  

## 2022-02-15 NOTE — ED Triage Notes (Signed)
Pt was recently diagnosed with hypertension this past Monday, pt not on meds currently. Pt states she has head pressure and chest pressure, and swelling in her hands.

## 2022-02-15 NOTE — ED Provider Notes (Signed)
[facility] Provider Note    Event Date/Time   First MD Initiated Contact with Patient 02/15/22 1226     (approximate)   History   Hypertension   HPI  Isabel Robertson is a 37 y.o. female   Past medical history of CAD, asthma, bipolar and seizures who presents to the emergency department with hand swelling, chest discomfort, headache for the past several days and found to have high blood pressure on primary care visit earlier this week never started on medications.  She denies drug or alcohol use.  Has never been on antihypertensives.  Denies shortness of breath, respiratory infectious symptoms, GI or GU symptoms.    External Medical Documents Reviewed: [facility] department visit dated November 2022 when she was diagnosed with urinary tract infection for flank pain      Physical Exam   Triage Vital Signs: ED Triage Vitals  Enc Vitals Group     BP 02/15/22 1148 (!) 166/102     Pulse Rate 02/15/22 1148 90     Resp 02/15/22 1148 19     Temp 02/15/22 1148 98.6 F (37 C)     Temp Source 02/15/22 1148 Oral     SpO2 02/15/22 1148 100 %     Weight 02/15/22 1149 156 lb (70.8 kg)     Height 02/15/22 1149 5\' 4"  (1.626 m)     Head Circumference --      Peak Flow --      Pain Score 02/15/22 1149 3     Pain Loc --      Pain Edu? --      Excl. in [place]? --     Most recent vital signs: Vitals:   02/15/22 1148  BP: (!) 166/102  Pulse: 90  Resp: 19  Temp: 98.6 F (37 C)  SpO2: 100%    General: Awake, no distress.  CV:  Good peripheral perfusion.  Resp:  Normal effort.  Abd:  No distention.  Other:  Awake alert oriented no acute distress, heart sounds normal lung sounds clear to auscultation abdomen soft and nontender no focal neurologic deficits motor or sensory exam normal no facial symmetry no dysarthria normal gait.  Bilateral hands are slightly swollen, neurovascular intact hypertensive 160/100.   ED Results / Procedures / Treatments    Labs (all labs ordered are listed, but only abnormal results are displayed) Labs Reviewed  BASIC METABOLIC PANEL - Abnormal; Notable for the following components:      Result Value   Glucose, Bld 116 (*)    All other components within normal limits  CBC  POC URINE PREG, ED  TROPONIN I (HIGH SENSITIVITY)  TROPONIN I (HIGH SENSITIVITY)     I ordered and reviewed the above labs they are notable for kidney function is normal, initial troponin negative, glucose is 116  EKG  ED ECG REPORT I, Lucillie Garfinkel, the attending physician, personally viewed and interpreted this ECG.   Date: 02/15/2022  EKG Time: 1148  Rate: 94  Rhythm: nsr  Axis: nl  Intervals:none  ST&T Change: No acute ischemic changes    RADIOLOGY I independently reviewed and interpreted chest x-ray see no obvious focalities or pneumothorax   PROCEDURES:  Critical Care performed: No  Procedures   MEDICATIONS ORDERED IN ED: Medications  acetaminophen (TYLENOL) tablet 1,000 mg (has no administration in time range)     IMPRESSION / MDM / ASSESSMENT AND PLAN / ED COURSE  I reviewed the triage vital signs and the nursing  notes.                                Patient's presentation is most consistent with acute presentation with potential threat to life or bodily function.  Differential diagnosis includes, but is not limited to, hypertensive emergency, kidney dysfunction, ACS, dissection, PE, CVA or intracranial bleeding   The patient is on the cardiac monitor to evaluate for evidence of arrhythmia and/or significant heart rate changes.  MDM: With hypertension as well as multiple complaints including headache, chest pain, swelling in hands.  Fortunately kidney function is normal.  She appears well with no focal neurological deficits to suggest CVA or intracranial bleed, EKG is nonischemic initial troponin is negative.  Will check pregnancy test and follow-up with serial troponin.  If negative, can start  on antihypertensive and have her follow-up with PMD for blood pressure recheck.  Very low clinical suspicion for PE, CVA, intracranial bleeding, dissection  I considered hospitalization for admission or observation but given well-appearing patient with normal workup as above, I think outpatient monitoring and follow-up most appropriate at this time patient is in agreement.  She will return to the emergency department for any new or worsening symptoms.        FINAL CLINICAL IMPRESSION(S) / ED DIAGNOSES   Final diagnoses:  Uncontrolled hypertension  Swelling     Rx / DC Orders   ED Discharge Orders     None        Note:  This document was prepared using Dragon voice recognition software and may include unintentional dictation errors.    Lucillie Garfinkel, MD 02/15/22 1425

## 2022-04-02 ENCOUNTER — Ambulatory Visit
Admission: EM | Admit: 2022-04-02 | Discharge: 2022-04-02 | Disposition: A | Discharge status: Home / Self Care | Payer: Medicaid Other | Attending: Physician Assistant | Admitting: Physician Assistant

## 2022-04-02 ENCOUNTER — Encounter: Payer: Self-pay | Admitting: Emergency Medicine

## 2022-04-02 DIAGNOSIS — K047 Periapical abscess without sinus: Secondary | ICD-10-CM | POA: Diagnosis not present

## 2022-04-02 DIAGNOSIS — K0889 Other specified disorders of teeth and supporting structures: Secondary | ICD-10-CM | POA: Diagnosis not present

## 2022-04-02 MED ORDER — CLINDAMYCIN HCL 300 MG PO CAPS: 300.0000 mg | ORAL_CAPSULE | Freq: Four times a day (QID) | ORAL | 0 refills | Status: AC

## 2022-04-02 MED ORDER — HYDROCODONE-ACETAMINOPHEN 5-325 MG PO TABS: 1.0000 | ORAL_TABLET | Freq: Four times a day (QID) | ORAL | 0 refills | Status: AC | PRN

## 2022-04-02 NOTE — ED Provider Notes (Signed)
[facility]    CSN: [id number] Arrival date & time: 04/02/22  0901      History   Chief Complaint Chief Complaint  Patient presents with   Dental Pain    HPI Isabel Robertson is a 37 y.o. female presents for dental pain for tooth on the right upper side.  She says it has been cracked for a while. She says it only just now started hurting in the early morning hours today.  She is taken aspirin without relief.  Reports some facial swelling on the right side.  Denies any fever.  Reports that she contacted a dentist but is not able to get into see anyone today.  No other complaints.  HPI  Past Medical History:  Diagnosis Date   Anxiety    Asthma    Bipolar 1 disorder ([place])    Seizures ([place])     Patient Active Problem List   Diagnosis Date Noted   Nondisplaced fracture of fifth metatarsal bone, left foot, subsequent encounter for fracture with routine healing 06/23/17 06/28/2017   Vaginal bleeding 08/17/2014    Past Surgical History:  Procedure Laterality Date   TUBAL LIGATION  11/2010    OB History   No obstetric history on file.      Home Medications    Prior to Admission medications   Medication Sig Start Date End Date Taking? Authorizing Provider  clindamycin (CLEOCIN) 300 MG capsule Take 1 capsule (300 mg total) by mouth 4 (four) times daily for 7 days. 04/02/22 04/09/22 Yes Danton Clap, PA-C  HYDROcodone-acetaminophen (NORCO/VICODIN) 5-325 MG tablet Take 1 tablet by mouth every 6 (six) hours as needed for up to 2 days for severe pain. 04/02/22 04/04/22 Yes Danton Clap, PA-C  hydrochlorothiazide (HYDRODIURIL) 12.5 MG tablet Take 1 tablet (12.5 mg total) by mouth daily. 02/15/22 05/16/22  Lucillie Garfinkel, MD    Family History Family History  Problem Relation Age of Onset   Hypertension Father     Social History Social History   Tobacco Use   Smoking status: Every Day    Packs/day: 1    Types: Cigarettes   Smokeless tobacco: Never  Vaping Use    Vaping Use: Never used  Substance Use Topics   Alcohol use: Yes    Comment: occasional   Drug use: No     Allergies   Egg-derived products, Penicillins, Bee venom, Iodine, and Latex   Review of Systems Review of Systems  Constitutional:  Negative for fatigue and fever.  HENT:  Positive for dental problem and facial swelling.   Neurological:  Negative for headaches.  Hematological:  Negative for adenopathy.     Physical Exam Triage Vital Signs ED Triage Vitals  Enc Vitals Group     BP      Pulse      Resp      Temp      Temp src      SpO2      Weight      Height      Head Circumference      Peak Flow      Pain Score      Pain Loc      Pain Edu?      Excl. in [facility] [employer] [facility]?    No data found.  Updated Vital Signs BP 125/85 (BP Location: Right Arm)   Pulse 63   Temp 98.2 F (36.8 C) (Oral)   Resp 16   Ht 5\' 4"  (1.626 m)  Wt 155 lb 13.8 oz (70.7 kg)   LMP 03/19/2022 (Approximate)   SpO2 97%   BMI 26.75 kg/m      Physical Exam Vitals and nursing note reviewed.  Constitutional:      General: She is not in acute distress.    Appearance: Normal appearance. She is not ill-appearing or toxic-appearing.  HENT:     Head: Normocephalic and atraumatic.     Nose: Nose normal.     Mouth/Throat:     Mouth: Mucous membranes are moist.     Dentition: Dental tenderness present.     Pharynx: Oropharynx is clear.      Comments: Right upper molar is tender and there is mild surrounding erythema Eyes:     General: No scleral icterus.       Right eye: No discharge.        Left eye: No discharge.     Conjunctiva/sclera: Conjunctivae normal.  Cardiovascular:     Rate and Rhythm: Normal rate and regular rhythm.     Heart sounds: Normal heart sounds.  Pulmonary:     Effort: Pulmonary effort is normal. No respiratory distress.     Breath sounds: Normal breath sounds.  Musculoskeletal:     Cervical back: Neck supple.  Skin:    General: Skin is dry.  Neurological:      General: No focal deficit present.     Mental Status: She is alert. Mental status is at baseline.     Motor: No weakness.     Gait: Gait normal.  Psychiatric:        Mood and Affect: Mood normal.        Behavior: Behavior normal.        Thought Content: Thought content normal.      UC Treatments / Results  Labs (all labs ordered are listed, but only abnormal results are displayed) Labs Reviewed - No data to display  EKG   Radiology No results found.  Procedures Procedures (including critical care time)  Medications Ordered in UC Medications - No data to display  Initial Impression / Assessment and Plan / UC Course  I have reviewed the triage vital signs and the nursing notes.  Pertinent labs & imaging results that were available during my care of the patient were reviewed by me and considered in my medical decision making (see chart for details).   37 year old female presents for dental pain in the tooth of the right upper side that began in the early morning hours today.  No fever.  No relief with OTC meds.  Unable to see a dentist today.  On exam she does have tenderness of the right upper molar and surrounding mild swelling and erythema.  Mild swelling of the right cheek compared to left.  Treating for suspected infection of clindamycin as she has history of anaphylaxis to penicillins.  Also sent 6 tabs of Norco as needed for severe pain.  Advised her to follow-up with dentistry as soon as possible.   Final Clinical Impressions(s) / UC Diagnoses   Final diagnoses:  Pain, dental  Dental infection     Discharge Instructions      Follow up with dentist at the next available appointment.  In the meantime, we will cover for dental infection with Clindamycin.  Take NSAIDs/Tylenol for pain relief. Consider Orajel also. May ice the area. Follow up with dentist in the next few days. In some cases, the tooth may needed to be extracted. Follow up with [place]  or ER sooner if the  condition worsens before the dental appointment. If they develop a fever, significant soft tissue swelling, or worse pain, go to ER      ED Prescriptions     Medication Sig Dispense Auth. Provider   clindamycin (CLEOCIN) 300 MG capsule Take 1 capsule (300 mg total) by mouth 4 (four) times daily for 7 days. 28 capsule Laurene Footman B, PA-C   HYDROcodone-acetaminophen (NORCO/VICODIN) 5-325 MG tablet Take 1 tablet by mouth every 6 (six) hours as needed for up to 2 days for severe pain. 6 tablet Danton Clap, PA-C      I have reviewed the PDMP during this encounter.   Danton Clap, PA-C 04/02/22 1045

## 2022-04-02 NOTE — Discharge Instructions (Addendum)
Follow up with dentist at the next available appointment.  In the meantime, we will cover for dental infection with Clindamycin.  Take NSAIDs/Tylenol for pain relief. Consider Orajel also. May ice the area. Follow up with dentist in the next few days. In some cases, the tooth may needed to be extracted. Follow up with [place] or ER sooner if the condition worsens before the dental appointment. If they develop a fever, significant soft tissue swelling, or worse pain, go to ER

## 2022-04-02 NOTE — ED Triage Notes (Signed)
Pt c/o dental pain on the right upper. Started this morning. She states she has a cracked tooth.

## 2023-02-04 ENCOUNTER — Ambulatory Visit
Admission: EM | Admit: 2023-02-04 | Discharge: 2023-02-04 | Disposition: A | Discharge status: Home / Self Care | Payer: Medicaid Other | Attending: Emergency Medicine | Admitting: Emergency Medicine

## 2023-02-04 DIAGNOSIS — S0993XA Unspecified injury of face, initial encounter: Secondary | ICD-10-CM

## 2023-02-04 DIAGNOSIS — K047 Periapical abscess without sinus: Secondary | ICD-10-CM

## 2023-02-04 NOTE — ED Provider Notes (Signed)
HPI  SUBJECTIVE:  Kaymarie Wynn is a 38 y.o. female who presents with constant, throbbing left upper molar pain, sensitivity to air and temperature after cracking her tooth last night.  She reports left-sided headache, ear pain and sore throat.  She reports facial swelling along her cheek.  No fevers, trismus.  States that her lower teeth are without injury.  She tried [facility] powders without improvement in her symptoms.  Symptoms are worse with exposure to air with eating.  She has a dentist appointment in 2 weeks.  States that she is unable to eat because of the pain.  She has a past medical history of seizures, asthma.  She states that she has tolerated Augmentin before.   Past Medical History:  Diagnosis Date   Anxiety    Asthma    Bipolar 1 disorder ([facility])    Seizures ([facility])     Past Surgical History:  Procedure Laterality Date   TUBAL LIGATION  11/2010    Family History  Problem Relation Age of Onset   Hypertension Father     Social History   Tobacco Use   Smoking status: Every Day    Current packs/day: 1.00    Types: Cigarettes   Smokeless tobacco: Never  Vaping Use   Vaping status: Never Used  Substance Use Topics   Alcohol use: Yes    Comment: occasional   Drug use: No    No current facility-administered medications for this encounter.  Current Outpatient Medications:    amoxicillin-clavulanate (AUGMENTIN) 875-125 MG tablet, Take 1 tablet by mouth every 12 (twelve) hours., Disp: 14 tablet, Rfl: 0   hydrochlorothiazide (HYDRODIURIL) 12.5 MG tablet, Take 1 tablet (12.5 mg total) by mouth daily., Disp: 30 tablet, Rfl: 2   ibuprofen (ADVIL) 600 MG tablet, Take 1 tablet (600 mg total) by mouth every 6 (six) hours as needed., Disp: 30 tablet, Rfl: 0  Allergies  Allergen Reactions   Egg-Derived Products Anaphylaxis   Penicillins Anaphylaxis    Has patient had a PCN reaction causing immediate rash, facial/tongue/throat swelling, SOB or lightheadedness with hypotension:  No Has patient had a PCN reaction causing severe rash involving mucus membranes or skin necrosis: No Has patient had a PCN reaction that required hospitalization No Has patient had a PCN reaction occurring within the last 10 years: No If all of the above answers are "NO", then may proceed with Cephalosporin use.    Bee Venom    Iodine Rash   Latex Rash     ROS  As noted in HPI.   Physical Exam  BP (!) 131/56 (BP Location: Right Arm)   Pulse 93   Temp 97.9 F (36.6 C) (Oral)   Resp 16   LMP 01/16/2023 (Approximate)   SpO2 96%   Constitutional: Well developed, well nourished, no acute distress Eyes:  EOMI, conjunctiva normal bilaterally HENT: Normocephalic, atraumatic,mucus membranes moist.  Mild left cheek swelling.  No overlying erythema.  Tooth #16, left upper third molar tenderness to palpation.  Positive dental cary.  Positive gingival tenderness.  No erythema, swelling.  No drooling, trismus, swelling under the jaw. Neck: No cervical lymphadenopathy Respiratory: Normal inspiratory effort Cardiovascular: Normal rate GI: nondistended skin: No rash, skin intact Musculoskeletal: no deformities Neurologic: Alert & oriented x 3, no focal neuro deficits Psychiatric: Speech and behavior appropriate   ED Course   Medications - No data to display  No orders of the defined types were placed in this encounter.   No results found for this  or any previous visit (from the past 24 hours). No results found.  ED Clinical Impression  1. Dental infection   2. Pain due to dental trauma      ED Assessment/Plan     Patient presents with a dental infection status post trauma.  Unfortunately, it is too far back in the mouth for me to do a dental block.  Home with Augmentin.  She states that she has tolerated this before without any problem.  Tylenol/ibuprofen, warm compresses, salt water/Listerine rinses.  Will provide a dental list. ER Return precautions given.  Discussed   MDM, treatment plan, and plan for follow-up with patient. Discussed sn/sx that should prompt return to the ED. patient agrees with plan.   Accidentally did not prescribe medications at discharge.  E-prescribed Augmentin and ibuprofen to pharmacy on record the next morning.  Patient was notified.  No orders of the defined types were placed in this encounter.     *This clinic note was created using Dragon dictation software. Therefore, there may be occasional mistakes despite careful proofreading.  ?    Domenick Gong, MD 02/06/23 1734

## 2023-02-04 NOTE — Discharge Instructions (Signed)
Finish the Augmentin, even if you feel better.  Take 600 mg of ibuprofen combined with 1000 mg Tylenol 3 times a day.  Warm compresses.  Listerine rinses especially after you eat, and salt water rinses as often as you want.  Go to the ER for the signs and symptoms we discussed.  OPTIONS FOR DENTAL FOLLOW UP CARE  [place] Department of Health and Human Services - Local Safety Net Dental Clinics [email or web address]   [facility] [phone])  [facility] [phone])  [place] [phone])  [facility] [phone])  [facility] [phone]) This clinic caters to the indigent population and is on a lottery system. Location: [employer] [facility], [employer], [address] [place] Clinic Hours: Wednesdays from 6pm - 9pm, patients seen by a lottery system. For dates, call or go to [email or web address] Services: Cleanings, fillings and simple extractions. Payment Options: DENTAL WORK IS FREE OF CHARGE. Bring proof of income or support. Best way to get seen: Arrive at 5:15 pm - this is a lottery, NOT first come/first serve, so arriving earlier will not increase your chances of being seen.     [facility] [phone] Select option 1 for emergencies   Location: [facility], [place], [address] [place] Clinic Hours: No walk-ins accepted - call the day before to schedule an appointment. Check in times are 9:30 am and 1:30 pm. Services: Simple extractions, temporary fillings, pulpectomy/pulp debridement, uncomplicated abscess drainage. Payment Options: PAYMENT IS DUE AT THE TIME OF SERVICE.  Fee is usually $100-200, additional surgical procedures (e.g. abscess drainage) may be extra. Cash, checks, Visa/MasterCard accepted.  Can file Medicaid if patient is covered for dental -  patient should call case worker to check. No discount for [facility] patients. Best way to get seen: MUST call the day before and get onto the schedule. Can usually be seen the next 1-2 days. No walk-ins accepted.     [facility] [phone]   Location: [facility], [address] [place] Clinic Hours: M, W, Th, F 8am or 1:30pm, Tues 9a or 1:30 - first come/first served. Services: Simple extractions, temporary fillings, uncomplicated abscess drainage.  You do not need to be an [facility] resident. Payment Options: PAYMENT IS DUE AT THE TIME OF SERVICE. Dental insurance, otherwise sliding scale - bring proof of income or support. Depending on income and treatment needed, cost is usually $50-200. Best way to get seen: Arrive early as it is first come/first served.     [facility] [phone]   Location: [address] Clinic Hours: Mon-Thu 8a-5p Services: Most basic dental services including extractions and fillings. Payment Options: PAYMENT IS DUE AT THE TIME OF SERVICE. Sliding scale, up to 50% off - bring proof if income or support. Medicaid with dental option accepted. Best way to get seen: Call to schedule an appointment, can usually be seen within 2 weeks OR they will try to see walk-ins - show up at 8a or 2p (you may have to wait).     [facility] [phone] [place] RESIDENTS ONLY   Location: [facility], [address] [place], [place] [address] Clinic Hours: By appointment only. Monday - Thursday 8am-5pm, Friday 8am-12pm Services: Cleanings, fillings, extractions. Payment Options: PAYMENT IS DUE AT THE TIME OF SERVICE. Cash, Visa or MasterCard. Sliding scale - $30 minimum per service. Best way  to get seen: Come in to office, complete packet and make an appointment - need proof of income or support monies for each household  member and proof of [facility] residence. Usually takes about a month to get in.     [facility] [phone]   Location: [address], [facility] Clinic Hours: Walk-in Urgent Care Dental Services are offered Monday-Friday mornings only. The numbers of emergencies accepted daily is limited to the number of providers available. Maximum 15 - Mondays, Wednesdays & Thursdays Maximum 10 - Tuesdays & Fridays Services: You do not need to be a [facility] resident to be seen for a dental emergency. Emergencies are defined as pain, swelling, abnormal bleeding, or dental trauma. Walkins will receive x-rays if needed. NOTE: Dental cleaning is not an emergency. Payment Options: PAYMENT IS DUE AT THE TIME OF SERVICE. Minimum co-pay is $40.00 for uninsured patients. Minimum co-pay is $3.00 for Medicaid with dental coverage. Dental Insurance is accepted and must be presented at time of visit. Medicare does not cover dental. Forms of payment: Cash, credit card, checks. Best way to get seen: If not previously registered with the clinic, walk-in dental registration begins at 7:15 am and is on a first come/first serve basis. If previously registered with the clinic, call to make an appointment.     [facility] [phone] [place] RESIDENTS ONLY   Location: [address], [place], [place] Clinic Hours: Mon-Thu 10a-2p Services: Extractions only! Payment Options: FREE (donations accepted) - bring proof of income or support Best way to get seen: Call and schedule an appointment OR come at 8am on the 1st Monday of every month (except for holidays) when it is first come/first served.     [facility] [phone]   Location: [address] [place], [place] Clinic Hours: Friday mornings Services, Payment Options, Best way to get seen: Call for info

## 2023-02-04 NOTE — ED Triage Notes (Signed)
Patient presents to [facility] for left sided dental pain. States she has a chipped tooth, has appt in two weeks with dentist. Taking BC powder for pain.

## 2023-02-05 ENCOUNTER — Telehealth: Payer: Self-pay | Admitting: Emergency Medicine

## 2023-02-05 MED ORDER — AMOXICILLIN-POT CLAVULANATE 875-125 MG PO TABS: 1.0000 | ORAL_TABLET | Freq: Two times a day (BID) | ORAL | 0 refills | Status: DC

## 2023-02-05 MED ORDER — IBUPROFEN 600 MG PO TABS: 600.0000 mg | ORAL_TABLET | Freq: Four times a day (QID) | ORAL | 0 refills | Status: AC | PRN

## 2023-02-05 MED ORDER — AMOXICILLIN-POT CLAVULANATE 875-125 MG PO TABS: 1.0000 | ORAL_TABLET | Freq: Two times a day (BID) | ORAL | 0 refills | Status: AC

## 2023-02-05 NOTE — Telephone Encounter (Signed)
Forgot to prescribe Augmentin and ibuprofen on discharge last night.  E-prescribing to pharmacy on record right now.   patient states that she has tolerated Augmentin before.
